# Patient Record
Sex: Male | Born: 1985
Health system: Southern US, Community
[De-identification: ages and names within clinical notes are randomized; demographics above are authoritative.]

## PROBLEM LIST (undated history)

## (undated) DIAGNOSIS — F419 Anxiety disorder, unspecified: Secondary | ICD-10-CM

## (undated) DIAGNOSIS — K219 Gastro-esophageal reflux disease without esophagitis: Secondary | ICD-10-CM

## (undated) DIAGNOSIS — E079 Disorder of thyroid, unspecified: Secondary | ICD-10-CM

## (undated) HISTORY — DX: Anxiety disorder, unspecified: F41.9

## (undated) HISTORY — DX: Gastro-esophageal reflux disease without esophagitis: K21.9

## (undated) HISTORY — DX: Disorder of thyroid, unspecified: E07.9

---

## 2015-05-28 ENCOUNTER — Ambulatory Visit (INDEPENDENT_AMBULATORY_CARE_PROVIDER_SITE_OTHER): Payer: BLUE CROSS/BLUE SHIELD | Admitting: *Deleted

## 2015-05-28 DIAGNOSIS — Z23 Encounter for immunization: Secondary | ICD-10-CM | POA: Diagnosis not present

## 2016-03-18 DIAGNOSIS — E0590 Thyrotoxicosis, unspecified without thyrotoxic crisis or storm: Secondary | ICD-10-CM | POA: Diagnosis not present

## 2016-03-18 DIAGNOSIS — F411 Generalized anxiety disorder: Secondary | ICD-10-CM | POA: Diagnosis not present

## 2016-03-18 DIAGNOSIS — E785 Hyperlipidemia, unspecified: Secondary | ICD-10-CM | POA: Diagnosis not present

## 2016-05-22 DIAGNOSIS — I10 Essential (primary) hypertension: Secondary | ICD-10-CM | POA: Diagnosis not present

## 2016-05-22 DIAGNOSIS — E0590 Thyrotoxicosis, unspecified without thyrotoxic crisis or storm: Secondary | ICD-10-CM | POA: Diagnosis not present

## 2016-05-22 DIAGNOSIS — E785 Hyperlipidemia, unspecified: Secondary | ICD-10-CM | POA: Diagnosis not present

## 2016-11-20 DIAGNOSIS — E785 Hyperlipidemia, unspecified: Secondary | ICD-10-CM | POA: Diagnosis not present

## 2016-11-20 DIAGNOSIS — E0590 Thyrotoxicosis, unspecified without thyrotoxic crisis or storm: Secondary | ICD-10-CM | POA: Diagnosis not present

## 2016-11-20 DIAGNOSIS — Z0000 Encounter for general adult medical examination without abnormal findings: Secondary | ICD-10-CM | POA: Diagnosis not present

## 2016-11-20 DIAGNOSIS — I10 Essential (primary) hypertension: Secondary | ICD-10-CM | POA: Diagnosis not present

## 2017-06-16 DIAGNOSIS — E785 Hyperlipidemia, unspecified: Secondary | ICD-10-CM | POA: Diagnosis not present

## 2017-06-16 DIAGNOSIS — E0590 Thyrotoxicosis, unspecified without thyrotoxic crisis or storm: Secondary | ICD-10-CM | POA: Diagnosis not present

## 2017-06-16 DIAGNOSIS — I10 Essential (primary) hypertension: Secondary | ICD-10-CM | POA: Diagnosis not present

## 2017-09-25 ENCOUNTER — Ambulatory Visit: Payer: BLUE CROSS/BLUE SHIELD | Admitting: Physician Assistant

## 2017-09-25 ENCOUNTER — Encounter: Payer: Self-pay | Admitting: Physician Assistant

## 2017-09-25 VITALS — BP 139/82 | HR 109 | Temp 97.0°F | Wt 226.4 lb

## 2017-09-25 DIAGNOSIS — F419 Anxiety disorder, unspecified: Secondary | ICD-10-CM | POA: Diagnosis not present

## 2017-09-25 MED ORDER — ALPRAZOLAM 0.5 MG PO TABS
0.5000 mg | ORAL_TABLET | Freq: Two times a day (BID) | ORAL | 1 refills | Status: DC | PRN
Start: 2017-09-25 — End: 2017-10-28

## 2017-09-25 MED ORDER — CITALOPRAM HYDROBROMIDE 20 MG PO TABS: 20.0000 mg | ORAL_TABLET | Freq: Every day | ORAL | 3 refills | Status: DC

## 2017-09-25 NOTE — Patient Instructions (Signed)
Panic Attacks Panic attacks are sudden, short feelings of great fear or discomfort. You may have them for no reason when you are relaxed, when you are uneasy (anxious), or when you are sleeping. Follow these instructions at home:  Take all your medicines as told.  Check with your doctor before starting new medicines.  Keep all doctor visits. Contact a doctor if:  You are not able to take your medicines as told.  Your symptoms do not get better.  Your symptoms get worse. Get help right away if:  Your attacks seem different than your normal attacks.  You have thoughts about hurting yourself or others.  You take panic attack medicine and you have a side effect. This information is not intended to replace advice given to you by your health care provider. Make sure you discuss any questions you have with your health care provider. Document Released: 09/06/2010 Document Revised: 01/10/2016 Document Reviewed: 03/18/2013 Elsevier Interactive Patient Education  2017 Elsevier Inc.  

## 2017-09-25 NOTE — Progress Notes (Signed)
BP 139/82   Pulse (!) 109   Temp (!) 97 F (36.1 C) (Oral)   Wt 226 lb 6.4 oz (102.7 kg)    Subjective:    Patient ID: Christian Fowler, male    DOB: 08/21/1985, 32 y.o.   MRN: [id number]  Christian Fowler is a 32 y.o. male presenting on 09/25/2017 for New Patient (Initial Visit) and Establish Care  Patient with primary complaint of depression and anxiety.  Has had panic attacks over the past few months. Quit drinking alcohol Aug 18, 2017.  Strong family history of anxiety and depression.  Over does well and no significant illnesses.  He is due for labs in the near future.  Depression screen PHQ 2/9 09/25/2017  Decreased Interest 0  Down, Depressed, Hopeless 0  PHQ - 2 Score 0    Past Medical History:  Diagnosis Date  . Anxiety   . GERD (gastroesophageal reflux disease)   . Thyroid disease    Relevant past medical, surgical, family and social history reviewed and updated as indicated. Interim medical history since our last visit reviewed. Allergies and medications reviewed and updated.   Data reviewed from any sources in EPIC.  Review of Systems  Constitutional: Negative.  Negative for appetite change and fatigue.  HENT: Negative.   Eyes: Negative.  Negative for pain and visual disturbance.  Respiratory: Negative.  Negative for cough, chest tightness, shortness of breath and wheezing.   Cardiovascular: Negative.  Negative for chest pain, palpitations and leg swelling.  Gastrointestinal: Negative.  Negative for abdominal pain, diarrhea, nausea and vomiting.  Endocrine: Negative.   Genitourinary: Negative.   Musculoskeletal: Negative.   Skin: Negative.  Negative for color change and rash.  Neurological: Negative.  Negative for weakness, numbness and headaches.  Psychiatric/Behavioral: Positive for decreased concentration and sleep disturbance. The patient is nervous/anxious.      Social History   Socioeconomic History  . Marital status: Single    Spouse name: Not on file  .  Number of children: Not on file  . Years of education: Not on file  . Highest education level: Not on file  Social Needs  . Financial resource strain: Not on file  . Food insecurity - worry: Not on file  . Food insecurity - inability: Not on file  . Transportation needs - medical: Not on file  . Transportation needs - non-medical: Not on file  Occupational History  . Not on file  Tobacco Use  . Smoking status: Never Smoker  . Smokeless tobacco: Never Used  Substance and Sexual Activity  . Alcohol use: Yes    Alcohol/week: 1.2 oz    Types: 2 Cans of beer per week  . Drug use: No  . Sexual activity: Not Currently  Other Topics Concern  . Not on file  Social History Narrative  . Not on file    History reviewed. No pertinent surgical history.  History reviewed. No pertinent family history.  Allergies as of 09/25/2017   No Known Allergies     Medication List        Accurate as of 09/25/17  9:56 AM. Always use your most recent med list.          ALPRAZolam 0.5 MG tablet Commonly known as:  XANAX Take 1 tablet (0.5 mg total) by mouth 2 (two) times daily as needed for anxiety.   citalopram 20 MG tablet Commonly known as:  CELEXA Take 1 tablet (20 mg total) by mouth daily.   KLOR-CON M20  20 MEQ tablet Generic drug:  potassium chloride SA   methimazole 5 MG tablet Commonly known as:  TAPAZOLE TAKE ONE TABLET (5 MG) BY MOUTH EVERY OTHER DAY ALTERNATING  WITH  2  TABLETS  EVERY  OTHER  DAY   multivitamin tablet Take 1 tablet by mouth daily.   simvastatin 20 MG tablet Commonly known as:  ZOCOR Take by mouth.          Objective:    BP 139/82   Pulse (!) 109   Temp (!) 97 F (36.1 C) (Oral)   Wt 226 lb 6.4 oz (102.7 kg)   No Known Allergies Wt Readings from Last 3 Encounters:  09/25/17 226 lb 6.4 oz (102.7 kg)    Physical Exam  Constitutional: He appears well-developed and well-nourished.  HENT:  Head: Normocephalic and atraumatic.  Eyes: Conjunctivae  and EOM are normal. Pupils are equal, round, and reactive to light.  Neck: Normal range of motion. Neck supple.  Cardiovascular: Normal rate, regular rhythm and normal heart sounds.  Pulmonary/Chest: Effort normal and breath sounds normal.  Abdominal: Soft. Bowel sounds are normal.  Musculoskeletal: Normal range of motion.  Skin: Skin is warm and dry.  Nursing note and vitals reviewed.   No results found for this or any previous visit.    Assessment & Plan:   1. Anxiety disorder, unspecified type - citalopram (CELEXA) 20 MG tablet; Take 1 tablet (20 mg total) by mouth daily.  Dispense: 30 tablet; Refill: 3 - ALPRAZolam (XANAX) 0.5 MG tablet; Take 1 tablet (0.5 mg total) by mouth 2 (two) times daily as needed for anxiety.  Dispense: 20 tablet; Refill: 1    Continue all other maintenance medications as listed above. Educational handout given for survey  Follow up plan: Return in about 4 weeks (around 10/23/2017) for recheck.  Remus Loffler PA-C [facility] [address]  [place], [place] [address] [phone]   09/25/2017, 9:56 AM

## 2017-10-08 ENCOUNTER — Ambulatory Visit (INDEPENDENT_AMBULATORY_CARE_PROVIDER_SITE_OTHER): Payer: BLUE CROSS/BLUE SHIELD

## 2017-10-08 ENCOUNTER — Ambulatory Visit: Payer: BLUE CROSS/BLUE SHIELD | Admitting: Family

## 2017-10-08 ENCOUNTER — Encounter: Payer: Self-pay | Admitting: Family

## 2017-10-08 VITALS — BP 126/80 | HR 124 | Temp 97.0°F | Ht 70.0 in | Wt 226.0 lb

## 2017-10-08 DIAGNOSIS — R6889 Other general symptoms and signs: Secondary | ICD-10-CM

## 2017-10-08 DIAGNOSIS — R0602 Shortness of breath: Secondary | ICD-10-CM

## 2017-10-08 DIAGNOSIS — R05 Cough: Secondary | ICD-10-CM | POA: Diagnosis not present

## 2017-10-08 LAB — VERITOR FLU A/B WAIVED
Influenza A: NEGATIVE
Influenza B: NEGATIVE

## 2017-10-08 MED ORDER — OSELTAMIVIR PHOSPHATE 75 MG PO CAPS: 75.0000 mg | ORAL_CAPSULE | Freq: Two times a day (BID) | ORAL | 0 refills | Status: DC

## 2017-10-08 NOTE — Patient Instructions (Signed)

## 2017-10-08 NOTE — Progress Notes (Signed)
   Subjective:    Patient ID: Barron Schmidexter Quinonez, male    DOB: 01/02/1986, 32 y.o.   MRN: [id number]  Sinusitis  This is a new problem. The current episode started in the past 7 days. The problem has been waxing and waning since onset. There has been no fever. His pain is at a severity of 5/10. He is experiencing no pain. Associated symptoms include chills, congestion, coughing, headaches, a hoarse voice, shortness of breath, sinus pressure and sneezing. Pertinent negatives include no ear pain or sore throat. Past treatments include oral decongestants. The treatment provided mild relief.      Review of Systems  Constitutional: Positive for chills.  HENT: Positive for congestion, hoarse voice, sinus pressure and sneezing. Negative for ear pain and sore throat.   Respiratory: Positive for cough and shortness of breath.   Neurological: Positive for headaches.  All other systems reviewed and are negative.      Objective:   Physical Exam  Constitutional: He is oriented to person, place, and time. He appears well-developed and well-nourished. He appears ill. No distress.  HENT:  Head: Normocephalic.  Right Ear: External ear normal.  Left Ear: External ear normal.  Nose: Mucosal edema and rhinorrhea present.  Mouth/Throat: Posterior oropharyngeal erythema present.  Eyes: Pupils are equal, round, and reactive to light. Right eye exhibits no discharge. Left eye exhibits no discharge.  Neck: Normal range of motion. Neck supple. No thyromegaly present.  Cardiovascular: Regular rhythm, normal heart sounds and intact distal pulses. Tachycardia present.  No murmur heard. Pulmonary/Chest: Effort normal and breath sounds normal. No respiratory distress. He has no wheezes.  Abdominal: Soft. Bowel sounds are normal. He exhibits no distension. There is no tenderness.  Musculoskeletal: Normal range of motion. He exhibits no edema or tenderness.  Neurological: He is alert and oriented to person, place, and  time.  Skin: Skin is warm and dry. No rash noted. No erythema.  Psychiatric: He has a normal mood and affect. His behavior is normal. Judgment and thought content normal.  Vitals reviewed.     BP 126/80   Pulse (!) 124 Comment: 114  Temp (!) 97 F (36.1 C) (Oral)   Ht 5\' 10"  (1.778 m)   Wt 226 lb (102.5 kg)   SpO2 96%   BMI 32.43 kg/m      Assessment & Plan:  1. Flu-like symptoms Rest Force fluids Tylenol or motrin Good hand hygiene discussed, cover mouth when coughing or sneezing RTO prn or if symptoms worsen or do not improve - Veritor Flu A/B Waived - DG Chest 2 View; Future - oseltamivir (TAMIFLU) 75 MG capsule; Take 1 capsule (75 mg total) by mouth 2 (two) times daily.  Dispense: 10 capsule; Refill: 0  2. SOB (shortness of breath) - DG Chest 2 View; Future    Jannifer Rodneyhristy Aldon Hengst, FNP

## 2017-10-28 ENCOUNTER — Encounter: Payer: Self-pay | Admitting: Physician Assistant

## 2017-10-28 ENCOUNTER — Ambulatory Visit: Payer: BLUE CROSS/BLUE SHIELD | Admitting: Physician Assistant

## 2017-10-28 ENCOUNTER — Ambulatory Visit (INDEPENDENT_AMBULATORY_CARE_PROVIDER_SITE_OTHER): Payer: BLUE CROSS/BLUE SHIELD | Admitting: Physician Assistant

## 2017-10-28 VITALS — BP 124/70 | HR 72 | Temp 97.0°F | Ht 70.0 in | Wt 229.2 lb

## 2017-10-28 DIAGNOSIS — E0590 Thyrotoxicosis, unspecified without thyrotoxic crisis or storm: Secondary | ICD-10-CM

## 2017-10-28 DIAGNOSIS — Z0000 Encounter for general adult medical examination without abnormal findings: Secondary | ICD-10-CM | POA: Diagnosis not present

## 2017-10-28 DIAGNOSIS — L309 Dermatitis, unspecified: Secondary | ICD-10-CM

## 2017-10-28 DIAGNOSIS — E876 Hypokalemia: Secondary | ICD-10-CM

## 2017-10-28 DIAGNOSIS — F419 Anxiety disorder, unspecified: Secondary | ICD-10-CM | POA: Insufficient documentation

## 2017-10-28 MED ORDER — CITALOPRAM HYDROBROMIDE 20 MG PO TABS: 20.0000 mg | ORAL_TABLET | Freq: Every day | ORAL | 5 refills | Status: DC

## 2017-10-28 MED ORDER — METHIMAZOLE 5 MG PO TABS: ORAL_TABLET | ORAL | 5 refills | Status: DC

## 2017-10-28 MED ORDER — KLOR-CON M20 20 MEQ PO TBCR: 20.0000 meq | EXTENDED_RELEASE_TABLET | Freq: Once | ORAL | 5 refills | Status: DC

## 2017-10-28 MED ORDER — ALPRAZOLAM 0.5 MG PO TABS: 0.5000 mg | ORAL_TABLET | Freq: Two times a day (BID) | ORAL | 2 refills | Status: DC | PRN

## 2017-10-28 MED ORDER — CLOBETASOL PROPIONATE 0.05 % EX CREA: 1.0000 "application " | TOPICAL_CREAM | Freq: Two times a day (BID) | CUTANEOUS | 2 refills | Status: DC

## 2017-10-28 NOTE — Progress Notes (Signed)
BP 124/70   Pulse 72   Temp (!) 97 F (36.1 C) (Oral)   Ht 5\' 10"  (1.778 m)   Wt 229 lb 3.2 oz (104 kg)   BMI 32.89 kg/m    Subjective:    Patient ID: Christian Fowler, male    DOB: 04-14-1986, 32 y.o.   MRN: [id number]  HPI: Christian Fowler is a 32 y.o. male presenting on 10/28/2017 for Annual Exam (and rck on medications )  This patient comes in for annual well physical examination. All medications are reviewed today. There are no reports of any problems with the medications. All of the medical conditions are reviewed and updated.  Lab work is reviewed and will be ordered as medically necessary. There are no new problems reported with today's visit.  Patient reports doing well overall.  Sending medications to pharmacy.  Past Medical History:  Diagnosis Date  . Anxiety   . GERD (gastroesophageal reflux disease)   . Thyroid disease    Relevant past medical, surgical, family and social history reviewed and updated as indicated. Interim medical history since our last visit reviewed. Allergies and medications reviewed and updated. DATA REVIEWED: CHART IN EPIC  Family History reviewed for pertinent findings.  Review of Systems  Constitutional: Negative.  Negative for appetite change and fatigue.  HENT: Negative.   Eyes: Negative.  Negative for pain and visual disturbance.  Respiratory: Negative.  Negative for cough, chest tightness, shortness of breath and wheezing.   Cardiovascular: Negative.  Negative for chest pain, palpitations and leg swelling.  Gastrointestinal: Negative.  Negative for abdominal pain, diarrhea, nausea and vomiting.  Endocrine: Negative.   Genitourinary: Negative.   Musculoskeletal: Negative.   Skin: Negative.  Negative for color change and rash.  Neurological: Negative.  Negative for weakness, numbness and headaches.  Psychiatric/Behavioral: Positive for decreased concentration. Negative for sleep disturbance and suicidal ideas. The patient is  nervous/anxious.     Allergies as of 10/28/2017   No Known Allergies     Medication List        Accurate as of 10/28/17 10:58 AM. Always use your most recent med list.          ALPRAZolam 0.5 MG tablet Commonly known as:  XANAX Take 1 tablet (0.5 mg total) by mouth 2 (two) times daily as needed for anxiety.   citalopram 20 MG tablet Commonly known as:  CELEXA Take 1 tablet (20 mg total) by mouth daily.   clobetasol cream 0.05 % Commonly known as:  TEMOVATE Apply 1 application topically 2 (two) times daily.   KLOR-CON M20 20 MEQ tablet Generic drug:  potassium chloride SA Take 1 tablet (20 mEq total) by mouth once for 1 dose.   methimazole 5 MG tablet Commonly known as:  TAPAZOLE TAKE ONE TABLET (5 MG) BY MOUTH EVERY OTHER DAY ALTERNATING  WITH  2  TABLETS  EVERY  OTHER  DAY   multivitamin tablet Take 1 tablet by mouth daily.          Objective:    BP 124/70   Pulse 72   Temp (!) 97 F (36.1 C) (Oral)   Ht 5\' 10"  (1.778 m)   Wt 229 lb 3.2 oz (104 kg)   BMI 32.89 kg/m   No Known Allergies  Wt Readings from Last 3 Encounters:  10/28/17 229 lb 3.2 oz (104 kg)  10/08/17 226 lb (102.5 kg)  09/25/17 226 lb 6.4 oz (102.7 kg)    Physical Exam  Constitutional: He appears  well-developed and well-nourished.  HENT:  Head: Normocephalic and atraumatic.  Eyes: Conjunctivae and EOM are normal. Pupils are equal, round, and reactive to light.  Neck: Normal range of motion. Neck supple.  Cardiovascular: Normal rate, regular rhythm and normal heart sounds.  Pulmonary/Chest: Effort normal and breath sounds normal.  Abdominal: Soft. Bowel sounds are normal.  Musculoskeletal: Normal range of motion.  Skin: Skin is warm and dry.    Results for orders placed or performed in visit on 10/08/17  Veritor Flu A/B Waived  Result Value Ref Range   Influenza A Negative Negative   Influenza B Negative Negative      Assessment & Plan:   1. Well adult exam  2.  Anxiety  3. Anxiety disorder, unspecified type - ALPRAZolam (XANAX) 0.5 MG tablet; Take 1 tablet (0.5 mg total) by mouth 2 (two) times daily as needed for anxiety.  Dispense: 30 tablet; Refill: 2 - citalopram (CELEXA) 20 MG tablet; Take 1 tablet (20 mg total) by mouth daily.  Dispense: 30 tablet; Refill: 5   Continue all other maintenance medications as listed above.  Follow up plan: Return in about 4 weeks (around 11/25/2017) for recheck and labs, RECORD RELEASE.  Educational handout given for survey  Remus LofflerAngel S. Braian Tijerina PA-C [facility] [address]  [place], [place] [id number] [phone]   10/28/2017, 10:58 AM

## 2017-10-28 NOTE — Patient Instructions (Signed)
In a few days you may receive a survey in the mail or online from [employer] regarding your visit with us today. Please take a moment to fill this out. Your feedback is very important to our whole office. It can help us better understand your needs as well as improve your experience and satisfaction. Thank you for taking your time to complete it. We care about you.  Eljay Lave, PA-C  

## 2017-10-29 ENCOUNTER — Telehealth: Payer: Self-pay

## 2017-10-29 NOTE — Telephone Encounter (Signed)
Eczema

## 2017-10-29 NOTE — Telephone Encounter (Signed)
I am getting a prior auth for Clobetasol cream   I need a DX?

## 2017-10-30 ENCOUNTER — Other Ambulatory Visit: Payer: Self-pay | Admitting: Physician Assistant

## 2017-10-30 DIAGNOSIS — E876 Hypokalemia: Secondary | ICD-10-CM

## 2017-10-30 MED ORDER — KLOR-CON M20 20 MEQ PO TBCR: 20.0000 meq | EXTENDED_RELEASE_TABLET | Freq: Every day | ORAL | 5 refills | Status: DC

## 2017-11-04 ENCOUNTER — Telehealth: Payer: Self-pay | Admitting: Physician Assistant

## 2017-11-05 ENCOUNTER — Telehealth: Payer: Self-pay

## 2017-11-05 NOTE — Telephone Encounter (Signed)
Insurance denied Clobetasol cream  Formulary are Augmented betamethasone gel/ointment  Clobetasol 0.05% ointment/solution  Halobetasol cream/ointment

## 2017-11-05 NOTE — Telephone Encounter (Signed)
In process 

## 2017-11-06 ENCOUNTER — Other Ambulatory Visit: Payer: Self-pay | Admitting: Physician Assistant

## 2017-11-06 MED ORDER — CLOBETASOL PROPIONATE 0.05 % EX OINT: 1.0000 "application " | TOPICAL_OINTMENT | Freq: Two times a day (BID) | CUTANEOUS | 3 refills | Status: DC

## 2017-12-01 ENCOUNTER — Ambulatory Visit: Payer: BLUE CROSS/BLUE SHIELD | Admitting: Physician Assistant

## 2017-12-01 ENCOUNTER — Encounter: Payer: Self-pay | Admitting: Physician Assistant

## 2017-12-01 VITALS — BP 108/67 | HR 73 | Temp 97.4°F | Ht 70.0 in | Wt 225.2 lb

## 2017-12-01 DIAGNOSIS — Z0000 Encounter for general adult medical examination without abnormal findings: Secondary | ICD-10-CM | POA: Diagnosis not present

## 2017-12-01 DIAGNOSIS — F419 Anxiety disorder, unspecified: Secondary | ICD-10-CM | POA: Diagnosis not present

## 2017-12-01 DIAGNOSIS — B356 Tinea cruris: Secondary | ICD-10-CM

## 2017-12-01 DIAGNOSIS — E876 Hypokalemia: Secondary | ICD-10-CM | POA: Diagnosis not present

## 2017-12-01 DIAGNOSIS — L309 Dermatitis, unspecified: Secondary | ICD-10-CM | POA: Insufficient documentation

## 2017-12-01 DIAGNOSIS — E0590 Thyrotoxicosis, unspecified without thyrotoxic crisis or storm: Secondary | ICD-10-CM | POA: Diagnosis not present

## 2017-12-01 MED ORDER — ALPRAZOLAM 0.5 MG PO TABS: 0.5000 mg | ORAL_TABLET | Freq: Two times a day (BID) | ORAL | 5 refills | Status: DC | PRN

## 2017-12-01 MED ORDER — CICLOPIROX OLAMINE 0.77 % EX CREA: TOPICAL_CREAM | Freq: Two times a day (BID) | CUTANEOUS | 5 refills | Status: DC

## 2017-12-02 ENCOUNTER — Ambulatory Visit: Payer: BLUE CROSS/BLUE SHIELD | Admitting: Physician Assistant

## 2017-12-02 LAB — CBC WITH DIFFERENTIAL/PLATELET
BASOS ABS: 0 10*3/uL (ref 0.0–0.2)
Basos: 1 %
EOS (ABSOLUTE): 0.2 10*3/uL (ref 0.0–0.4)
Eos: 3 %
HEMATOCRIT: 47 % (ref 37.5–51.0)
Hemoglobin: 15.4 g/dL (ref 13.0–17.7)
Immature Grans (Abs): 0 10*3/uL (ref 0.0–0.1)
Immature Granulocytes: 0 %
LYMPHS ABS: 2.1 10*3/uL (ref 0.7–3.1)
Lymphs: 35 %
MCH: 27.9 pg (ref 26.6–33.0)
MCHC: 32.8 g/dL (ref 31.5–35.7)
MCV: 85 fL (ref 79–97)
MONOS ABS: 0.6 10*3/uL (ref 0.1–0.9)
Monocytes: 10 %
NEUTROS PCT: 51 %
Neutrophils Absolute: 3.1 10*3/uL (ref 1.4–7.0)
PLATELETS: 280 10*3/uL (ref 150–379)
RBC: 5.51 x10E6/uL (ref 4.14–5.80)
RDW: 13.8 % (ref 12.3–15.4)
WBC: 6 10*3/uL (ref 3.4–10.8)

## 2017-12-02 LAB — THYROID PANEL WITH TSH
Free Thyroxine Index: 1.5 (ref 1.2–4.9)
T3 UPTAKE RATIO: 23 % — AB (ref 24–39)
T4, Total: 6.6 ug/dL (ref 4.5–12.0)
TSH: 2.16 u[IU]/mL (ref 0.450–4.500)

## 2017-12-02 LAB — LIPID PANEL
CHOLESTEROL TOTAL: 144 mg/dL (ref 100–199)
Chol/HDL Ratio: 4.2 ratio (ref 0.0–5.0)
HDL: 34 mg/dL — AB (ref >39)
LDL CALC: 98 mg/dL (ref 0–99)
TRIGLYCERIDES: 61 mg/dL (ref 0–149)
VLDL Cholesterol Cal: 12 mg/dL (ref 5–40)

## 2017-12-02 LAB — CMP14+EGFR
ALK PHOS: 54 IU/L (ref 39–117)
ALT: 25 IU/L (ref 0–44)
AST: 18 IU/L (ref 0–40)
Albumin/Globulin Ratio: 2 (ref 1.2–2.2)
Albumin: 4.3 g/dL (ref 3.5–5.5)
BUN/Creatinine Ratio: 17 (ref 9–20)
BUN: 13 mg/dL (ref 6–20)
Bilirubin Total: 0.2 mg/dL (ref 0.0–1.2)
CO2: 25 mmol/L (ref 20–29)
Calcium: 8.9 mg/dL (ref 8.7–10.2)
Chloride: 105 mmol/L (ref 96–106)
Creatinine, Ser: 0.75 mg/dL — ABNORMAL LOW (ref 0.76–1.27)
GFR calc Af Amer: 143 mL/min/{1.73_m2} (ref >59)
GFR calc non Af Amer: 124 mL/min/{1.73_m2} (ref >59)
GLOBULIN, TOTAL: 2.1 g/dL (ref 1.5–4.5)
GLUCOSE: 77 mg/dL (ref 65–99)
Potassium: 4.2 mmol/L (ref 3.5–5.2)
SODIUM: 142 mmol/L (ref 134–144)
Total Protein: 6.4 g/dL (ref 6.0–8.5)

## 2017-12-02 NOTE — Progress Notes (Signed)
BP 108/67   Pulse 73   Temp (!) 97.4 F (36.3 C) (Oral)   Ht _0  (1.778 m)   Wt 225 lb 3.2 oz (102.2 kg)   BMI 32.31 kg/m    Subjective:    Patient ID: Christian Fowler, male    DOB: 1985/10/05, 32 y.o.   MRN: [id number]  HPI: Christian Fowler is a 32 y.o. male presenting on 12/01/2017 for Follow-up (1 month  and labs )  Patient comes in for recheck on his conditions.  He reports that the clobetasol has helped his hands tremendously.  However now he is having a red itchy rash in his groin.  Is been made worse when he is at work and very hot.  He is due lab work today.  Otherwise he is doing very well. Depression screen [facility] 2/9 12/01/2017 10/28/2017 10/08/2017 09/25/2017  Decreased Interest 0 0 0 0  Down, Depressed, Hopeless 0 0 0 0  PHQ - 2 Score 0 0 0 0     Past Medical History:  Diagnosis Date  . Anxiety   . GERD (gastroesophageal reflux disease)   . Thyroid disease    Relevant past medical, surgical, family and social history reviewed and updated as indicated. Interim medical history since our last visit reviewed. Allergies and medications reviewed and updated. DATA REVIEWED: CHART IN EPIC  Family History reviewed for pertinent findings.  Review of Systems  Constitutional: Negative.  Negative for appetite change and fatigue.  HENT: Negative.   Eyes: Negative.  Negative for pain and visual disturbance.  Respiratory: Negative.  Negative for cough, chest tightness, shortness of breath and wheezing.   Cardiovascular: Negative.  Negative for chest pain, palpitations and leg swelling.  Gastrointestinal: Negative.  Negative for abdominal pain, diarrhea, nausea and vomiting.  Endocrine: Negative.   Genitourinary: Negative.   Musculoskeletal: Negative.   Skin: Positive for color change and rash.  Neurological: Negative.  Negative for weakness, numbness and headaches.  Psychiatric/Behavioral: Negative.     Allergies as of 12/01/2017   No Known Allergies     Medication List        Accurate as of 12/01/17 11:59 PM. Always use your most recent med list.          ALPRAZolam 0.5 MG tablet Commonly known as:  XANAX Take 1 tablet (0.5 mg total) by mouth 2 (two) times daily as needed for anxiety.   ciclopirox 0.77 % cream Commonly known as:  LOPROX Apply topically 2 (two) times daily. FOR fungal infection   citalopram 20 MG tablet Commonly known as:  CELEXA Take 1 tablet (20 mg total) by mouth daily.   clobetasol ointment 0.05 % Commonly known as:  TEMOVATE Apply 1 application topically 2 (two) times daily.   KLOR-CON M20 20 MEQ tablet Generic drug:  potassium chloride SA Take 1 tablet (20 mEq total) by mouth daily.   methimazole 5 MG tablet Commonly known as:  TAPAZOLE TAKE ONE TABLET (5 MG) BY MOUTH EVERY OTHER DAY ALTERNATING  WITH  2  TABLETS  EVERY  OTHER  DAY   multivitamin tablet Take 1 tablet by mouth daily.          Objective:    BP 108/67   Pulse 73   Temp (!) 97.4 F (36.3 C) (Oral)   Ht _1  (1.778 m)   Wt 225 lb 3.2 oz (102.2 kg)   BMI 32.31 kg/m   No Known Allergies  Wt Readings from Last 3 Encounters:  12/01/17  225 lb 3.2 oz (102.2 kg)  10/28/17 229 lb 3.2 oz (104 kg)  10/08/17 226 lb (102.5 kg)    Physical Exam  Constitutional: He appears well-developed and well-nourished. No distress.  HENT:  Head: Normocephalic and atraumatic.  Eyes: Pupils are equal, round, and reactive to light. Conjunctivae and EOM are normal.  Cardiovascular: Normal rate, regular rhythm and normal heart sounds.  Pulmonary/Chest: Effort normal and breath sounds normal. No respiratory distress.  Skin: Skin is warm and dry. Rash noted. There is erythema.  Psychiatric: He has a normal mood and affect. His behavior is normal.  Nursing note and vitals reviewed.   Results for orders placed or performed in visit on 12/01/17  CBC with Differential/Platelet  Result Value Ref Range   WBC 6.0 3.4 - 10.8 x10E3/uL   RBC 5.51 4.14 - 5.80 x10E6/uL    Hemoglobin 15.4 13.0 - 17.7 g/dL   Hematocrit 47.0 37.5 - 51.0 %   MCV 85 79 - 97 fL   MCH 27.9 26.6 - 33.0 pg   MCHC 32.8 31.5 - 35.7 g/dL   RDW 13.8 12.3 - 15.4 %   Platelets 280 150 - 379 x10E3/uL   Neutrophils 51 Not Estab. %   Lymphs 35 Not Estab. %   Monocytes 10 Not Estab. %   Eos 3 Not Estab. %   Basos 1 Not Estab. %   Neutrophils Absolute 3.1 1.4 - 7.0 x10E3/uL   Lymphocytes Absolute 2.1 0.7 - 3.1 x10E3/uL   Monocytes Absolute 0.6 0.1 - 0.9 x10E3/uL   EOS (ABSOLUTE) 0.2 0.0 - 0.4 x10E3/uL   Basophils Absolute 0.0 0.0 - 0.2 x10E3/uL   Immature Granulocytes 0 Not Estab. %   Immature Grans (Abs) 0.0 0.0 - 0.1 x10E3/uL  CMP14+EGFR  Result Value Ref Range   Glucose 77 65 - 99 mg/dL   BUN 13 6 - 20 mg/dL   Creatinine, Ser 0.75 (L) 0.76 - 1.27 mg/dL   GFR calc non Af Amer 124 >59 mL/min/1.73   GFR calc Af Amer 143 >59 mL/min/1.73   BUN/Creatinine Ratio 17 9 - 20   Sodium 142 134 - 144 mmol/L   Potassium 4.2 3.5 - 5.2 mmol/L   Chloride 105 96 - 106 mmol/L   CO2 25 20 - 29 mmol/L   Calcium 8.9 8.7 - 10.2 mg/dL   Total Protein 6.4 6.0 - 8.5 g/dL   Albumin 4.3 3.5 - 5.5 g/dL   Globulin, Total 2.1 1.5 - 4.5 g/dL   Albumin/Globulin Ratio 2.0 1.2 - 2.2   Bilirubin Total 0.2 0.0 - 1.2 mg/dL   Alkaline Phosphatase 54 39 - 117 IU/L   AST 18 0 - 40 IU/L   ALT 25 0 - 44 IU/L  Lipid panel  Result Value Ref Range   Cholesterol, Total 144 100 - 199 mg/dL   Triglycerides 61 0 - 149 mg/dL   HDL 34 (L) >39 mg/dL   VLDL Cholesterol Cal 12 5 - 40 mg/dL   LDL Calculated 98 0 - 99 mg/dL   Chol/HDL Ratio 4.2 0.0 - 5.0 ratio  Thyroid Panel With TSH  Result Value Ref Range   TSH 2.160 0.450 - 4.500 uIU/mL   T4, Total 6.6 4.5 - 12.0 ug/dL   T3 Uptake Ratio 23 (L) 24 - 39 %   Free Thyroxine Index 1.5 1.2 - 4.9      Assessment & Plan:   1. Anxiety disorder, unspecified type - ALPRAZolam (XANAX) 0.5 MG tablet; Take 1 tablet (0.5  mg total) by mouth 2 (two) times daily as needed for  anxiety.  Dispense: 30 tablet; Refill: 5  2. Hyperthyroidism - Thyroid Panel With TSH  3. Eczema, unspecified type  4. Tinea cruris - ciclopirox (LOPROX) 0.77 % cream; Apply topically 2 (two) times daily. FOR fungal infection  Dispense: 30 g; Refill: 5  5. Hypokalemia - CMP14+EGFR  6. Well adult exam - CBC with Differential/Platelet - CMP14+EGFR - Lipid panel - Thyroid Panel With TSH   Continue all other maintenance medications as listed above.  Follow up plan: Recheck 6 months  Educational handout given for [place] PA-C [place] [address]  [place], [place] [address] [phone]   12/02/2017, 10:17 AM

## 2018-06-01 ENCOUNTER — Encounter: Payer: Self-pay | Admitting: Physician Assistant

## 2018-06-01 ENCOUNTER — Ambulatory Visit: Payer: Managed Care, Other (non HMO) | Admitting: Physician Assistant

## 2018-06-01 VITALS — BP 130/76 | HR 73 | Ht 70.0 in | Wt 238.0 lb

## 2018-06-01 DIAGNOSIS — E876 Hypokalemia: Secondary | ICD-10-CM | POA: Diagnosis not present

## 2018-06-01 DIAGNOSIS — I8393 Asymptomatic varicose veins of bilateral lower extremities: Secondary | ICD-10-CM | POA: Diagnosis not present

## 2018-06-01 DIAGNOSIS — F419 Anxiety disorder, unspecified: Secondary | ICD-10-CM | POA: Diagnosis not present

## 2018-06-01 DIAGNOSIS — E0590 Thyrotoxicosis, unspecified without thyrotoxic crisis or storm: Secondary | ICD-10-CM | POA: Diagnosis not present

## 2018-06-01 DIAGNOSIS — Z0000 Encounter for general adult medical examination without abnormal findings: Secondary | ICD-10-CM

## 2018-06-01 MED ORDER — CITALOPRAM HYDROBROMIDE 20 MG PO TABS: 20.0000 mg | ORAL_TABLET | Freq: Every day | ORAL | 5 refills | Status: DC

## 2018-06-01 MED ORDER — METHIMAZOLE 5 MG PO TABS: ORAL_TABLET | ORAL | 5 refills | Status: DC

## 2018-06-01 MED ORDER — KLOR-CON M20 20 MEQ PO TBCR: 20.0000 meq | EXTENDED_RELEASE_TABLET | Freq: Every day | ORAL | 5 refills | Status: DC

## 2018-06-01 MED ORDER — ALPRAZOLAM 0.5 MG PO TABS: 0.5000 mg | ORAL_TABLET | Freq: Two times a day (BID) | ORAL | 5 refills | Status: DC | PRN

## 2018-06-01 NOTE — Progress Notes (Signed)
BP 130/76   Pulse 73   Ht _0  (1.778 m)   Wt 238 lb (108 kg)   BMI 34.15 kg/m    Subjective:    Patient ID: Christian Fowler, male    DOB: 11-16-1985, 32 y.o.   MRN: [id number]  HPI: Christian Fowler is a 32 y.o. male presenting on 06/01/2018 for Hyperthyroidism and Anxiety  This patient comes in for periodic recheck on medications and conditions including anxiety disorder, hypothyroidism controlled pantoprazole, hypokalemia.  At last labs everything was very stable.  We will recheck labs today.  He does take one Xanax a day before he goes into work.  There is a significant amount of stress going on there.  He reports that he is doing very well overall..   All medications are reviewed today. There are no reports of any problems with the medications. All of the medical conditions are reviewed and updated.  Lab work is reviewed and will be ordered as medically necessary. There are no new problems reported with today's visit. Depression screen [facility] 2/9 06/01/2018 12/01/2017 10/28/2017 10/08/2017 09/25/2017  Decreased Interest 0 0 0 0 0  Down, Depressed, Hopeless 0 0 0 0 0  PHQ - 2 Score 0 0 0 0 0     Past Medical History:  Diagnosis Date  . Anxiety   . GERD (gastroesophageal reflux disease)   . Thyroid disease    Relevant past medical, surgical, family and social history reviewed and updated as indicated. Interim medical history since our last visit reviewed. Allergies and medications reviewed and updated. DATA REVIEWED: CHART IN EPIC  Family History reviewed for pertinent findings.  Review of Systems  Constitutional: Negative.  Negative for appetite change and fatigue.  HENT: Negative.   Eyes: Negative.  Negative for pain and visual disturbance.  Respiratory: Negative.  Negative for cough, chest tightness, shortness of breath and wheezing.   Cardiovascular: Negative.  Negative for chest pain, palpitations and leg swelling.  Gastrointestinal: Negative.  Negative for abdominal pain,  diarrhea, nausea and vomiting.  Endocrine: Negative.   Genitourinary: Negative.   Musculoskeletal: Negative.   Skin: Negative.  Negative for color change and rash.  Neurological: Negative.  Negative for weakness, numbness and headaches.  Psychiatric/Behavioral: Negative.     Allergies as of 06/01/2018   No Known Allergies     Medication List        Accurate as of 06/01/18 10:46 AM. Always use your most recent med list.          ALPRAZolam 0.5 MG tablet Commonly known as:  XANAX Take 1 tablet (0.5 mg total) by mouth 2 (two) times daily as needed for anxiety.   citalopram 20 MG tablet Commonly known as:  CELEXA Take 1 tablet (20 mg total) by mouth daily.   KLOR-CON M20 20 MEQ tablet Generic drug:  potassium chloride SA Take 1 tablet (20 mEq total) by mouth daily.   methimazole 5 MG tablet Commonly known as:  TAPAZOLE TAKE ONE TABLET (5 MG) BY MOUTH EVERY OTHER DAY ALTERNATING  WITH  2  TABLETS  EVERY  OTHER  DAY   multivitamin tablet Take 1 tablet by mouth daily.          Objective:    BP 130/76   Pulse 73   Ht _1  (1.778 m)   Wt 238 lb (108 kg)   BMI 34.15 kg/m   No Known Allergies  Wt Readings from Last 3 Encounters:  06/01/18 238 lb (108 kg)  12/01/17 225 lb 3.2 oz (102.2 kg)  10/28/17 229 lb 3.2 oz (104 kg)    Physical Exam  Constitutional: He appears well-developed and well-nourished. No distress.  HENT:  Head: Normocephalic and atraumatic.  Eyes: Pupils are equal, round, and reactive to light. Conjunctivae and EOM are normal.  Cardiovascular: Normal rate, regular rhythm and normal heart sounds.  Varicose veins present bilateral lower legs, greater on right than left  Pulmonary/Chest: Effort normal and breath sounds normal. No respiratory distress.  Skin: Skin is warm and dry.  Psychiatric: He has a normal mood and affect. His behavior is normal.  Nursing note and vitals reviewed.   Results for orders placed or performed in visit on  12/01/17  CBC with Differential/Platelet  Result Value Ref Range   WBC 6.0 3.4 - 10.8 x10E3/uL   RBC 5.51 4.14 - 5.80 x10E6/uL   Hemoglobin 15.4 13.0 - 17.7 g/dL   Hematocrit 47.0 37.5 - 51.0 %   MCV 85 79 - 97 fL   MCH 27.9 26.6 - 33.0 pg   MCHC 32.8 31.5 - 35.7 g/dL   RDW 13.8 12.3 - 15.4 %   Platelets 280 150 - 379 x10E3/uL   Neutrophils 51 Not Estab. %   Lymphs 35 Not Estab. %   Monocytes 10 Not Estab. %   Eos 3 Not Estab. %   Basos 1 Not Estab. %   Neutrophils Absolute 3.1 1.4 - 7.0 x10E3/uL   Lymphocytes Absolute 2.1 0.7 - 3.1 x10E3/uL   Monocytes Absolute 0.6 0.1 - 0.9 x10E3/uL   EOS (ABSOLUTE) 0.2 0.0 - 0.4 x10E3/uL   Basophils Absolute 0.0 0.0 - 0.2 x10E3/uL   Immature Granulocytes 0 Not Estab. %   Immature Grans (Abs) 0.0 0.0 - 0.1 x10E3/uL  CMP14+EGFR  Result Value Ref Range   Glucose 77 65 - 99 mg/dL   BUN 13 6 - 20 mg/dL   Creatinine, Ser 0.75 (L) 0.76 - 1.27 mg/dL   GFR calc non Af Amer 124 >59 mL/min/1.73   GFR calc Af Amer 143 >59 mL/min/1.73   BUN/Creatinine Ratio 17 9 - 20   Sodium 142 134 - 144 mmol/L   Potassium 4.2 3.5 - 5.2 mmol/L   Chloride 105 96 - 106 mmol/L   CO2 25 20 - 29 mmol/L   Calcium 8.9 8.7 - 10.2 mg/dL   Total Protein 6.4 6.0 - 8.5 g/dL   Albumin 4.3 3.5 - 5.5 g/dL   Globulin, Total 2.1 1.5 - 4.5 g/dL   Albumin/Globulin Ratio 2.0 1.2 - 2.2   Bilirubin Total 0.2 0.0 - 1.2 mg/dL   Alkaline Phosphatase 54 39 - 117 IU/L   AST 18 0 - 40 IU/L   ALT 25 0 - 44 IU/L  Lipid panel  Result Value Ref Range   Cholesterol, Total 144 100 - 199 mg/dL   Triglycerides 61 0 - 149 mg/dL   HDL 34 (L) >39 mg/dL   VLDL Cholesterol Cal 12 5 - 40 mg/dL   LDL Calculated 98 0 - 99 mg/dL   Chol/HDL Ratio 4.2 0.0 - 5.0 ratio  Thyroid Panel With TSH  Result Value Ref Range   TSH 2.160 0.450 - 4.500 uIU/mL   T4, Total 6.6 4.5 - 12.0 ug/dL   T3 Uptake Ratio 23 (L) 24 - 39 %   Free Thyroxine Index 1.5 1.2 - 4.9      Assessment & Plan:   1. Anxiety  disorder, unspecified type - ALPRAZolam (XANAX) 0.5 MG tablet; Take  1 tablet (0.5 mg total) by mouth 2 (two) times daily as needed for anxiety.  Dispense: 30 tablet; Refill: 5 - citalopram (CELEXA) 20 MG tablet; Take 1 tablet (20 mg total) by mouth daily.  Dispense: 30 tablet; Refill: 5  2. Hyperthyroidism - methimazole (TAPAZOLE) 5 MG tablet; TAKE ONE TABLET (5 MG) BY MOUTH EVERY OTHER DAY ALTERNATING  WITH  2  TABLETS  EVERY  OTHER  DAY  Dispense: 45 tablet; Refill: 5 - Thyroid Panel With TSH  3. Hypokalemia - KLOR-CON M20 20 MEQ tablet; Take 1 tablet (20 mEq total) by mouth daily.  Dispense: 30 tablet; Refill: 5 - CMP14+EGFR  4. Well adult exam - Lipid panel  5. Varicose veins  Continue all other maintenance medications as listed above.  Follow up plan: Return in about 6 months (around 12/01/2018) for recheck.  Educational handout given for [place] PA-C [place] [address]  [place], [place] [address] [phone]   06/01/2018, 10:46 AM

## 2018-06-02 ENCOUNTER — Ambulatory Visit: Payer: BLUE CROSS/BLUE SHIELD | Admitting: Physician Assistant

## 2018-06-02 LAB — CMP14+EGFR
ALT: 28 IU/L (ref 0–44)
AST: 19 IU/L (ref 0–40)
Albumin/Globulin Ratio: 1.6 (ref 1.2–2.2)
Albumin: 4.4 g/dL (ref 3.5–5.5)
Alkaline Phosphatase: 58 IU/L (ref 39–117)
BUN/Creatinine Ratio: 10 (ref 9–20)
BUN: 9 mg/dL (ref 6–20)
Bilirubin Total: 0.5 mg/dL (ref 0.0–1.2)
CALCIUM: 9.3 mg/dL (ref 8.7–10.2)
CO2: 25 mmol/L (ref 20–29)
CREATININE: 0.94 mg/dL (ref 0.76–1.27)
Chloride: 100 mmol/L (ref 96–106)
GFR, EST AFRICAN AMERICAN: 125 mL/min/{1.73_m2} (ref >59)
GFR, EST NON AFRICAN AMERICAN: 108 mL/min/{1.73_m2} (ref >59)
GLOBULIN, TOTAL: 2.7 g/dL (ref 1.5–4.5)
Glucose: 88 mg/dL (ref 65–99)
Potassium: 3.9 mmol/L (ref 3.5–5.2)
Sodium: 141 mmol/L (ref 134–144)
TOTAL PROTEIN: 7.1 g/dL (ref 6.0–8.5)

## 2018-06-02 LAB — THYROID PANEL WITH TSH
Free Thyroxine Index: 1.9 (ref 1.2–4.9)
T3 Uptake Ratio: 26 % (ref 24–39)
T4, Total: 7.3 ug/dL (ref 4.5–12.0)
TSH: 1.32 u[IU]/mL (ref 0.450–4.500)

## 2018-06-02 LAB — LIPID PANEL
CHOL/HDL RATIO: 4.8 ratio (ref 0.0–5.0)
Cholesterol, Total: 182 mg/dL (ref 100–199)
HDL: 38 mg/dL — AB (ref >39)
LDL CALC: 124 mg/dL — AB (ref 0–99)
TRIGLYCERIDES: 98 mg/dL (ref 0–149)
VLDL Cholesterol Cal: 20 mg/dL (ref 5–40)

## 2018-11-03 ENCOUNTER — Other Ambulatory Visit: Payer: Self-pay | Admitting: Physician Assistant

## 2018-11-03 DIAGNOSIS — F419 Anxiety disorder, unspecified: Secondary | ICD-10-CM

## 2018-11-03 NOTE — Telephone Encounter (Signed)
Please advise 

## 2018-11-29 ENCOUNTER — Telehealth: Payer: Self-pay | Admitting: Physician Assistant

## 2018-11-29 NOTE — Telephone Encounter (Signed)
Patient was given a 30 day supply of xanax last month.  Can we make this a phone visit?

## 2018-11-29 NOTE — Telephone Encounter (Signed)
Patient notified

## 2018-11-29 NOTE — Telephone Encounter (Signed)
yes

## 2018-12-01 ENCOUNTER — Ambulatory Visit (INDEPENDENT_AMBULATORY_CARE_PROVIDER_SITE_OTHER): Payer: Managed Care, Other (non HMO) | Admitting: Physician Assistant

## 2018-12-01 ENCOUNTER — Other Ambulatory Visit: Payer: Self-pay

## 2018-12-01 DIAGNOSIS — E0590 Thyrotoxicosis, unspecified without thyrotoxic crisis or storm: Secondary | ICD-10-CM

## 2018-12-01 DIAGNOSIS — F419 Anxiety disorder, unspecified: Secondary | ICD-10-CM

## 2018-12-01 DIAGNOSIS — E876 Hypokalemia: Secondary | ICD-10-CM

## 2018-12-01 DIAGNOSIS — E039 Hypothyroidism, unspecified: Secondary | ICD-10-CM

## 2018-12-01 MED ORDER — METHIMAZOLE 5 MG PO TABS: ORAL_TABLET | ORAL | 5 refills | Status: DC

## 2018-12-01 MED ORDER — KLOR-CON M20 20 MEQ PO TBCR: 20.0000 meq | EXTENDED_RELEASE_TABLET | Freq: Every day | ORAL | 5 refills | Status: DC

## 2018-12-01 MED ORDER — ALPRAZOLAM 0.5 MG PO TABS: 0.5000 mg | ORAL_TABLET | Freq: Two times a day (BID) | ORAL | 0 refills | Status: DC | PRN

## 2018-12-02 ENCOUNTER — Encounter: Payer: Self-pay | Admitting: Physician Assistant

## 2018-12-02 ENCOUNTER — Telehealth: Payer: Self-pay | Admitting: Physician Assistant

## 2018-12-02 NOTE — Progress Notes (Signed)
Telephone visit  Subjective: CC: Anxiety, hyperthyroidism, hypokalemia PCP: Remus LofflerJones, [place]ucl[place] Cassetta S, PA-C ZOX:WRUEAVHPI:Uziah Franco ColletBartley is a 33 y.o. male calls for telephone consult today. Patient provides verbal consent for consult held via phone.  Patient is identified with 2 separate identifiers.  At this time the entire area is on COVID-19 social distancing and stay home orders are in place.  Patient is of higher risk and therefore we are performing this by a virtual method.  Location of patient: Home Location of provider: [facility] Others present for call: No  This is a periodic recheck for the patient's chronic medical conditions.  He states he has been doing well with his potassium and thyroid.  He has not had any additional symptoms.  However the COVID-19 and restrictions have had him having much more anxiety than normal.  He does have a young child and he is concerned about him getting sick. GAD 7 : Generalized Anxiety Score 12/01/2018  Nervous, Anxious, on Edge 3  Control/stop worrying 2  Worry too much - different things 2  Trouble relaxing 1  Restless 2  Easily annoyed or irritable 1  Afraid - awful might happen 2  Total GAD 7 Score 13    ANXIETY ASSESSMENT Cause of anxiety: generalized anxiety disorder with panic attacks  Current medications-citalopram gave him suicidal thoughts so it was discontinued.  He is using the alprazolam as needed.  We had a long conversation about BuSpar being used for persistent anxiety and he is willing to try it. Medication side effects-none Any concerns-none Any change in general medical condition-no Effectiveness of current meds-slightly decreased PMP AWARE website reviewed: Yes Any suspicious activity on PMP Aware: No LME daily dose: 2, with intermittent fills  Contract on file  History of overdose or risk of abuse no    ROS: Per HPI  No Known Allergies Past Medical History:  Diagnosis Date  . Anxiety   . GERD (gastroesophageal reflux disease)    . Thyroid disease     Current Outpatient Medications:  .  ALPRAZolam (XANAX) 0.5 MG tablet, Take 1 tablet (0.5 mg total) by mouth 2 (two) times daily as needed. for anxiety, Disp: 30 tablet, Rfl: 0 .  citalopram (CELEXA) 20 MG tablet, Take 1 tablet (20 mg total) by mouth daily., Disp: 30 tablet, Rfl: 5 .  KLOR-CON M20 20 MEQ tablet, Take 1 tablet (20 mEq total) by mouth daily., Disp: 30 tablet, Rfl: 5 .  methimazole (TAPAZOLE) 5 MG tablet, TAKE ONE TABLET (5 MG) BY MOUTH EVERY OTHER DAY ALTERNATING  WITH  2  TABLETS  EVERY  OTHER  DAY, Disp: 45 tablet, Rfl: 5 .  Multiple Vitamin (MULTIVITAMIN) tablet, Take 1 tablet by mouth daily., Disp: , Rfl:   Assessment/ Plan: 33 y.o. male   1. Anxiety disorder, unspecified type - ALPRAZolam (XANAX) 0.5 MG tablet; Take 1 tablet (0.5 mg total) by mouth 2 (two) times daily as needed. for anxiety  Dispense: 30 tablet; Refill: 0  2. Hypokalemia - KLOR-CON M20 20 MEQ tablet; Take 1 tablet (20 mEq total) by mouth daily.  Dispense: 30 tablet; Refill: 5  3. Hyperthyroidism - methimazole (TAPAZOLE) 5 MG tablet; TAKE ONE TABLET (5 MG) BY MOUTH EVERY OTHER DAY ALTERNATING  WITH  2  TABLETS  EVERY  OTHER  DAY  Dispense: 45 tablet; Refill: 5   Start time: 12:25 PM End time: 12:38 PM  Meds ordered this encounter  Medications  . ALPRAZolam (XANAX) 0.5 MG tablet    Sig: Take  1 tablet (0.5 mg total) by mouth 2 (two) times daily as needed. for anxiety    Dispense:  30 tablet    Refill:  0    Order Specific Question:   Supervising Provider    Answer:   Raliegh Ip [[id number]]  . KLOR-CON M20 20 MEQ tablet    Sig: Take 1 tablet (20 mEq total) by mouth daily.    Dispense:  30 tablet    Refill:  5    Order Specific Question:   Supervising Provider    Answer:   Raliegh Ip [[id number]]  . methimazole (TAPAZOLE) 5 MG tablet    Sig: TAKE ONE TABLET (5 MG) BY MOUTH EVERY OTHER DAY ALTERNATING  WITH  2  TABLETS  EVERY  OTHER  DAY    Dispense:  45  tablet    Refill:  5    Order Specific Question:   Supervising Provider    Answer:   Raliegh Ip [[id number]]    Prudy Feeler PA-C [facility] [phone]

## 2018-12-03 ENCOUNTER — Other Ambulatory Visit: Payer: Self-pay | Admitting: Physician Assistant

## 2018-12-03 MED ORDER — BUSPIRONE HCL 10 MG PO TABS: 10.0000 mg | ORAL_TABLET | Freq: Three times a day (TID) | ORAL | 5 refills | Status: DC

## 2018-12-03 NOTE — Telephone Encounter (Signed)
Aware.  Medication was sent in.

## 2018-12-03 NOTE — Telephone Encounter (Signed)
sent 

## 2018-12-25 ENCOUNTER — Encounter: Payer: Self-pay | Admitting: Physician Assistant

## 2018-12-27 ENCOUNTER — Telehealth: Payer: Self-pay | Admitting: Physician Assistant

## 2018-12-27 ENCOUNTER — Other Ambulatory Visit: Payer: Self-pay | Admitting: Physician Assistant

## 2018-12-27 DIAGNOSIS — F419 Anxiety disorder, unspecified: Secondary | ICD-10-CM

## 2018-12-27 MED ORDER — ALPRAZOLAM 0.5 MG PO TABS: 0.5000 mg | ORAL_TABLET | Freq: Two times a day (BID) | ORAL | 5 refills | Status: DC | PRN

## 2018-12-27 NOTE — Telephone Encounter (Signed)
Patient aware refills on file.

## 2019-06-08 ENCOUNTER — Telehealth: Payer: Self-pay | Admitting: Physician Assistant

## 2019-06-08 NOTE — Telephone Encounter (Signed)
Appointment scheduled 06/13/19 at 2:10 pm with Dr. Livia Snellen.

## 2019-06-10 ENCOUNTER — Other Ambulatory Visit: Payer: Self-pay

## 2019-06-13 ENCOUNTER — Other Ambulatory Visit: Payer: Self-pay

## 2019-06-13 ENCOUNTER — Encounter: Payer: Self-pay | Admitting: Family Medicine

## 2019-06-13 ENCOUNTER — Ambulatory Visit (INDEPENDENT_AMBULATORY_CARE_PROVIDER_SITE_OTHER): Payer: Managed Care, Other (non HMO) | Admitting: Family Medicine

## 2019-06-13 VITALS — BP 120/75 | HR 90 | Temp 99.0°F | Resp 20 | Ht 70.0 in | Wt 236.0 lb

## 2019-06-13 DIAGNOSIS — F419 Anxiety disorder, unspecified: Secondary | ICD-10-CM

## 2019-06-13 DIAGNOSIS — E0590 Thyrotoxicosis, unspecified without thyrotoxic crisis or storm: Secondary | ICD-10-CM

## 2019-06-13 DIAGNOSIS — Z024 Encounter for examination for driving license: Secondary | ICD-10-CM

## 2019-06-13 DIAGNOSIS — E669 Obesity, unspecified: Secondary | ICD-10-CM

## 2019-06-13 LAB — URINALYSIS
Bilirubin, UA: NEGATIVE
Glucose, UA: NEGATIVE
Ketones, UA: NEGATIVE
Leukocytes,UA: NEGATIVE
Nitrite, UA: NEGATIVE
Protein,UA: NEGATIVE
RBC, UA: NEGATIVE
Specific Gravity, UA: 1.02 (ref 1.005–1.030)
Urobilinogen, Ur: 0.2 mg/dL (ref 0.2–1.0)
pH, UA: 7 (ref 5.0–7.5)

## 2019-06-13 NOTE — Progress Notes (Signed)
Subjective:  Patient ID: Christian Fowler, male    DOB: 1985-09-18  Age: 33 y.o. MRN: [id number]  CC: DOT PE   HPI Christian Fowler presents for New DOT cert. Exam Laid off recently from [place]. Now going to work  Maintaining street lamps. Patient has a history of hypothyroidism which is managed by Aniceto Boss of this office.  He also has history of anxiety for which she takes BuSpar as needed as well as Xanax as needed.  Usually once a day for the BuSpar and once every day to every other day for the Xanax.  Depression screen Christian Fowler 2/9 06/13/2019 06/01/2018 12/01/2017  Decreased Interest 0 0 0  Down, Depressed, Hopeless 0 0 0  PHQ - 2 Score 0 0 0    History Christian Fowler has a past medical history of Anxiety, GERD (gastroesophageal reflux disease), and Thyroid disease.   He has no past surgical history on file.   His family history is not on file.He reports that he has never smoked. He has never used smokeless tobacco. He reports current alcohol use of about 2.0 standard drinks of alcohol per week. He reports that he does not use drugs.    ROS Review of Systems  Constitutional: Negative.   HENT: Negative.   Eyes: Negative for visual disturbance.  Respiratory: Negative for cough and shortness of breath.   Cardiovascular: Negative for chest pain and leg swelling.  Gastrointestinal: Negative for abdominal pain, diarrhea, nausea and vomiting.  Genitourinary: Negative for difficulty urinating.  Musculoskeletal: Negative for arthralgias and myalgias.  Skin: Negative for rash.  Neurological: Negative for headaches.  Psychiatric/Behavioral: Negative for sleep disturbance.    Objective:  BP 120/75   Pulse 90   Temp 99 F (37.2 C)   Resp 20   Ht 5\' 10"  (1.778 m)   Wt 236 lb (107 kg)   SpO2 99%   BMI 33.86 kg/m   BP Readings from Last 3 Encounters:  06/13/19 120/75  06/01/18 130/76  12/01/17 108/67    Wt Readings from Last 3 Encounters:  06/13/19 236 lb (107 kg)  06/01/18 238 lb  (108 kg)  12/01/17 225 lb 3.2 oz (102.2 kg)     Physical Exam Constitutional:      General: He is not in acute distress.    Appearance: He is well-developed.  HENT:     Head: Normocephalic and atraumatic.     Right Ear: External ear normal.     Left Ear: External ear normal.     Nose: Nose normal.  Eyes:     Conjunctiva/sclera: Conjunctivae normal.     Pupils: Pupils are equal, round, and reactive to light.  Neck:     Musculoskeletal: Normal range of motion and neck supple.  Cardiovascular:     Rate and Rhythm: Normal rate and regular rhythm.     Heart sounds: Normal heart sounds. No murmur.  Pulmonary:     Effort: Pulmonary effort is normal. No respiratory distress.     Breath sounds: Normal breath sounds. No wheezing or rales.  Abdominal:     Palpations: Abdomen is soft.     Tenderness: There is no abdominal tenderness.  Musculoskeletal: Normal range of motion.  Skin:    General: Skin is warm and dry.  Neurological:     Mental Status: He is alert and oriented to person, place, and time.     Deep Tendon Reflexes: Reflexes are normal and symmetric.  Psychiatric:        Behavior: Behavior  normal.        Thought Content: Thought content normal.        Judgment: Judgment normal.       Assessment & Plan:   Christian Fowler was seen today for dot pe.  Diagnoses and all orders for this visit:  Encounter for Department of Transportation (DOT) examination for driving license renewal -     Urinalysis  Hyperthyroidism  Anxiety disorder, unspecified type  Obesity (BMI 30.0-34.9)       I have discontinued Christian Fowler's citalopram. I am also having him maintain his multivitamin, Klor-Con M20, methimazole, busPIRone, and ALPRAZolam.  Allergies as of 06/13/2019      Reactions   Celexa [citalopram]    Suicidal thoughts       Medication List       Accurate as of June 13, 2019  3:03 PM. If you have any questions, ask your nurse or doctor.        STOP taking  these medications   citalopram 20 MG tablet Commonly known as: CELEXA Stopped by: Claretta Fraise, MD     TAKE these medications   ALPRAZolam 0.5 MG tablet Commonly known as: XANAX Take 1 tablet (0.5 mg total) by mouth 2 (two) times daily as needed. for anxiety   busPIRone 10 MG tablet Commonly known as: BUSPAR Take 1 tablet (10 mg total) by mouth 3 (three) times daily.   Klor-Con M20 20 MEQ tablet Generic drug: potassium chloride SA Take 1 tablet (20 mEq total) by mouth daily.   methimazole 5 MG tablet Commonly known as: TAPAZOLE TAKE ONE TABLET (5 MG) BY MOUTH EVERY OTHER DAY ALTERNATING  WITH  2  TABLETS  EVERY  OTHER  DAY   multivitamin tablet Take 1 tablet by mouth daily.      Discussed thyroid compliance, management. Weight loss. He should take the buspar BID on schedule so that he can wean off of the xanax.  Follow-up: Return in about 2 years (around 06/12/2021).  Claretta Fraise, M.D.

## 2019-06-30 ENCOUNTER — Telehealth: Payer: Self-pay | Admitting: Physician Assistant

## 2019-06-30 DIAGNOSIS — E876 Hypokalemia: Secondary | ICD-10-CM

## 2019-06-30 DIAGNOSIS — E0590 Thyrotoxicosis, unspecified without thyrotoxic crisis or storm: Secondary | ICD-10-CM

## 2019-06-30 MED ORDER — METHIMAZOLE 5 MG PO TABS: ORAL_TABLET | ORAL | 0 refills | Status: DC

## 2019-06-30 MED ORDER — KLOR-CON M20 20 MEQ PO TBCR: 20.0000 meq | EXTENDED_RELEASE_TABLET | Freq: Every day | ORAL | 0 refills | Status: DC

## 2019-06-30 MED ORDER — BUSPIRONE HCL 10 MG PO TABS: 10.0000 mg | ORAL_TABLET | Freq: Three times a day (TID) | ORAL | 0 refills | Status: DC

## 2019-06-30 NOTE — Telephone Encounter (Signed)
Pt aware 1 mos refill sent to pharmacy except alprazolam until appt with Glenard Haring this month

## 2019-07-12 ENCOUNTER — Ambulatory Visit (INDEPENDENT_AMBULATORY_CARE_PROVIDER_SITE_OTHER): Payer: Self-pay | Admitting: Physician Assistant

## 2019-07-12 DIAGNOSIS — F419 Anxiety disorder, unspecified: Secondary | ICD-10-CM

## 2019-07-12 DIAGNOSIS — E876 Hypokalemia: Secondary | ICD-10-CM

## 2019-07-12 DIAGNOSIS — E0590 Thyrotoxicosis, unspecified without thyrotoxic crisis or storm: Secondary | ICD-10-CM

## 2019-07-12 DIAGNOSIS — Z0000 Encounter for general adult medical examination without abnormal findings: Secondary | ICD-10-CM

## 2019-07-12 MED ORDER — METHIMAZOLE 5 MG PO TABS: ORAL_TABLET | ORAL | 0 refills | Status: DC

## 2019-07-12 MED ORDER — KLOR-CON M20 20 MEQ PO TBCR: 20.0000 meq | EXTENDED_RELEASE_TABLET | Freq: Every day | ORAL | 0 refills | Status: DC

## 2019-07-12 MED ORDER — ALPRAZOLAM 0.5 MG PO TABS: 0.5000 mg | ORAL_TABLET | Freq: Two times a day (BID) | ORAL | 5 refills | Status: DC | PRN

## 2019-07-13 ENCOUNTER — Telehealth: Payer: Self-pay | Admitting: *Deleted

## 2019-07-13 DIAGNOSIS — E876 Hypokalemia: Secondary | ICD-10-CM

## 2019-07-13 MED ORDER — POTASSIUM CHLORIDE CRYS ER 20 MEQ PO TBCR: 20.0000 meq | EXTENDED_RELEASE_TABLET | Freq: Every day | ORAL | 0 refills | Status: DC

## 2019-07-13 NOTE — Telephone Encounter (Signed)
yes

## 2019-07-13 NOTE — Telephone Encounter (Signed)
Fax from [facility] M20 20 meq [employer] cover brand, generic okay

## 2019-07-13 NOTE — Addendum Note (Signed)
Addended by: Antonietta Barcelona D on: 07/13/2019 09:24 AM   Modules accepted: Orders

## 2019-07-17 ENCOUNTER — Encounter: Payer: Self-pay | Admitting: Physician Assistant

## 2019-07-17 NOTE — Progress Notes (Signed)
Telephone visit  Subjective: CC: Recheck on chronic medical conditions PCP: Terald Sleeper, PA-C QJF:[id number] Jeff is a 33 y.o. male calls for telephone consult today. Patient provides verbal consent for consult held via phone.  Patient is identified with 2 separate identifiers.  At this time the entire area is on COVID-19 social distancing and stay home orders are in place.  Patient is of higher risk and therefore we are performing this by a virtual method.  Location of patient: Home Location of provider: HOME Others present for call: None  The patient is having a lot more anxiety and difficulty due to being laid off from his company.  He had been there many years.  And has had a permanent layoff.  He states that he does vape and is been doing more so.  I encouraged him to quit that as soon as possible.  He will try to work on this.  He also does have hyperthyroidism, GERD, anxiety.  All of his medications are reviewed and will be refilled.  And he will come in for fasting labs in the near future.   ROS: Per HPI  Allergies  Allergen Reactions  . Celexa [Citalopram]     Suicidal thoughts    Past Medical History:  Diagnosis Date  . Anxiety   . GERD (gastroesophageal reflux disease)   . Thyroid disease     Current Outpatient Medications:  .  ALPRAZolam (XANAX) 0.5 MG tablet, Take 1 tablet (0.5 mg total) by mouth 2 (two) times daily as needed. for anxiety, Disp: 30 tablet, Rfl: 5 .  methimazole (TAPAZOLE) 5 MG tablet, TAKE ONE TABLET (5 MG) BY MOUTH EVERY OTHER DAY ALTERNATING  WITH  2  TABLETS  EVERY  OTHER  DAY, Disp: 45 tablet, Rfl: 0 .  Multiple Vitamin (MULTIVITAMIN) tablet, Take 1 tablet by mouth daily., Disp: , Rfl:  .  potassium chloride SA (KLOR-CON M20) 20 MEQ tablet, Take 1 tablet (20 mEq total) by mouth daily., Disp: 30 tablet, Rfl: 0  Assessment/ Plan: 33 y.o. male   1. Anxiety disorder, unspecified type - ALPRAZolam (XANAX) 0.5 MG tablet; Take 1 tablet (0.5  mg total) by mouth 2 (two) times daily as needed. for anxiety  Dispense: 30 tablet; Refill: 5  2. Hypokalemia - CMP14+EGFR; Future  3. Hyperthyroidism - methimazole (TAPAZOLE) 5 MG tablet; TAKE ONE TABLET (5 MG) BY MOUTH EVERY OTHER DAY ALTERNATING  WITH  2  TABLETS  EVERY  OTHER  DAY  Dispense: 45 tablet; Refill: 0 - Thyroid Panel With TSH; Future  4. Well adult exam - CBC with Differential/Platelet; Future - CMP14+EGFR; Future - Lipid Panel; Future - Thyroid Panel With TSH; Future   Return in about 6 months (around 01/09/2020).  Continue all other maintenance medications as listed above.  Start time: 3:56 PM End time: 4:05 PM  Meds ordered this encounter  Medications  . ALPRAZolam (XANAX) 0.5 MG tablet    Sig: Take 1 tablet (0.5 mg total) by mouth 2 (two) times daily as needed. for anxiety    Dispense:  30 tablet    Refill:  5    Order Specific Question:   Supervising Provider    Answer:   Janora Norlander [[id number]]  . DISCONTD: KLOR-CON M20 20 MEQ tablet    Sig: Take 1 tablet (20 mEq total) by mouth daily.    Dispense:  30 tablet    Refill:  0    Order Specific Question:   Supervising Provider  AnswerJanora Norlander [[id number]]  . methimazole (TAPAZOLE) 5 MG tablet    Sig: TAKE ONE TABLET (5 MG) BY MOUTH EVERY OTHER DAY ALTERNATING  WITH  2  TABLETS  EVERY  OTHER  DAY    Dispense:  45 tablet    Refill:  0    Order Specific Question:   Supervising Provider    Answer:   Janora Norlander [[id number]]    Particia Nearing PA-C [facility] [phone]

## 2019-07-22 IMAGING — DX DG CHEST 2V
2 series · 2 of 2 positions shown · non-contrast
Comparison: None.

CLINICAL DATA: Cough.  Shortness of breath.  Flu-like symptoms.

EXAM:
CHEST  2 VIEW

[chest pa]
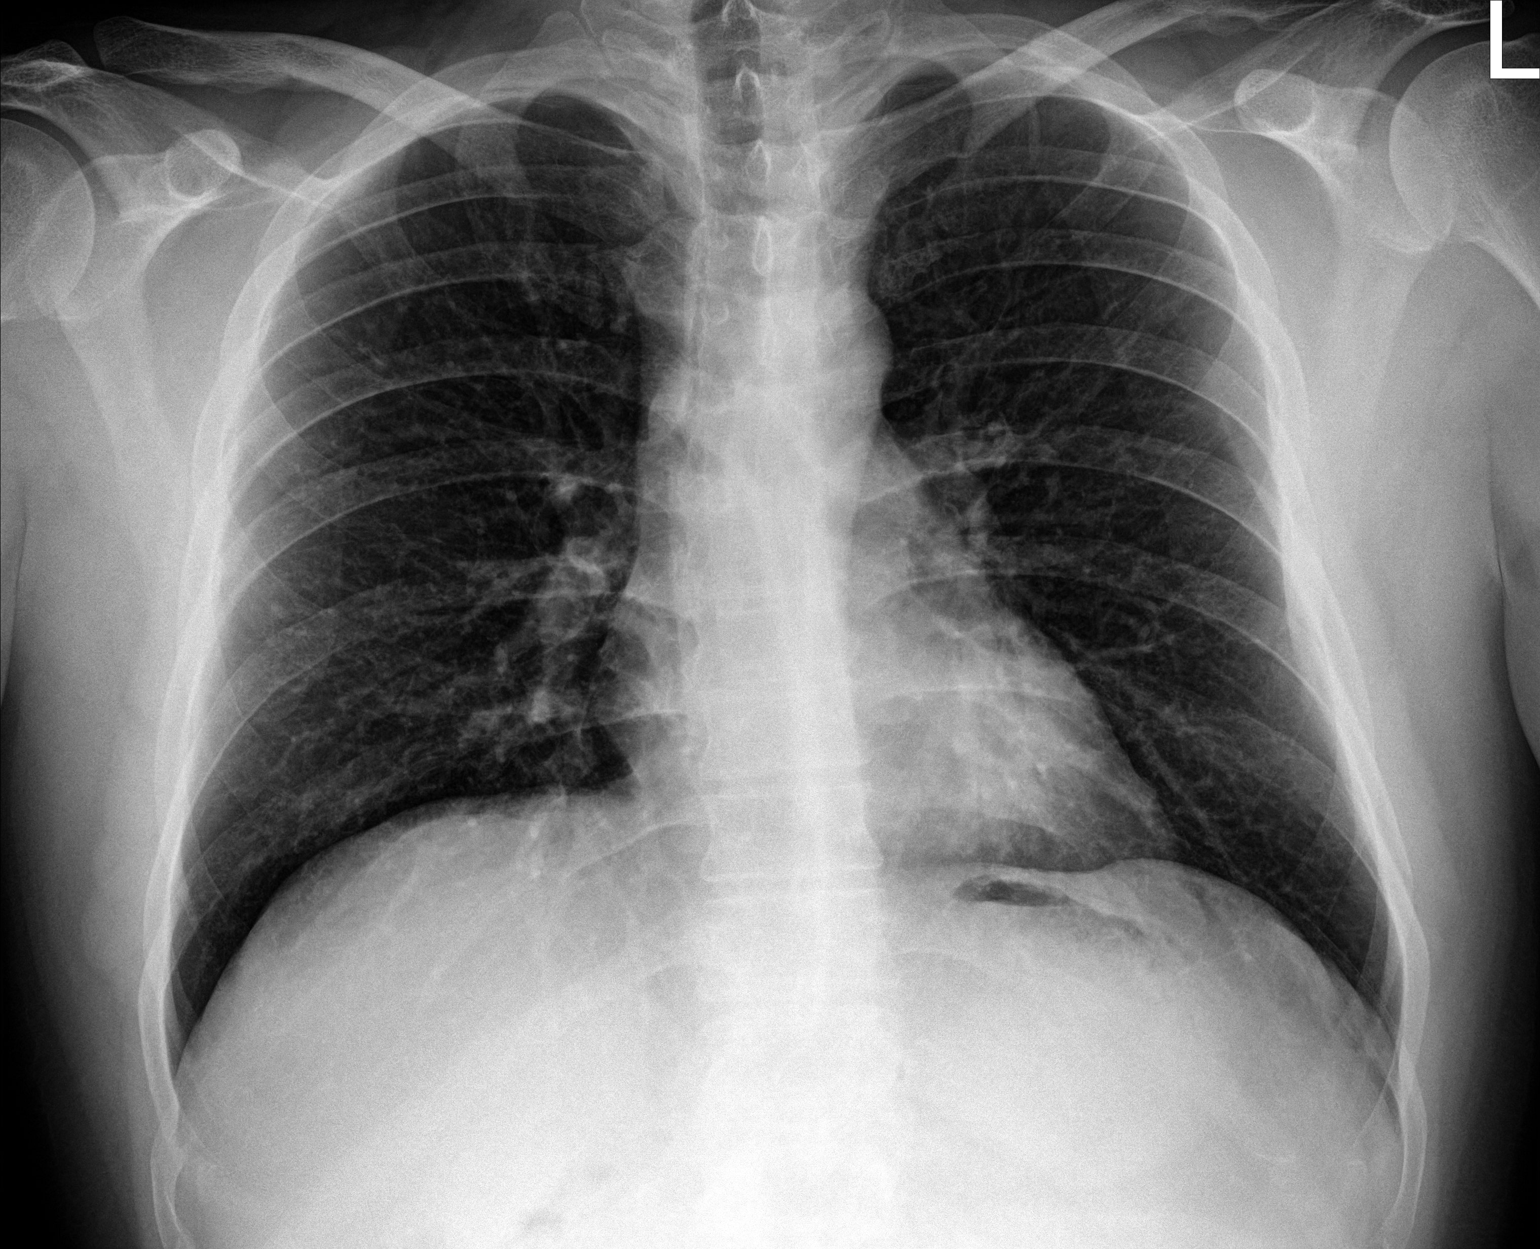

[chest lat]
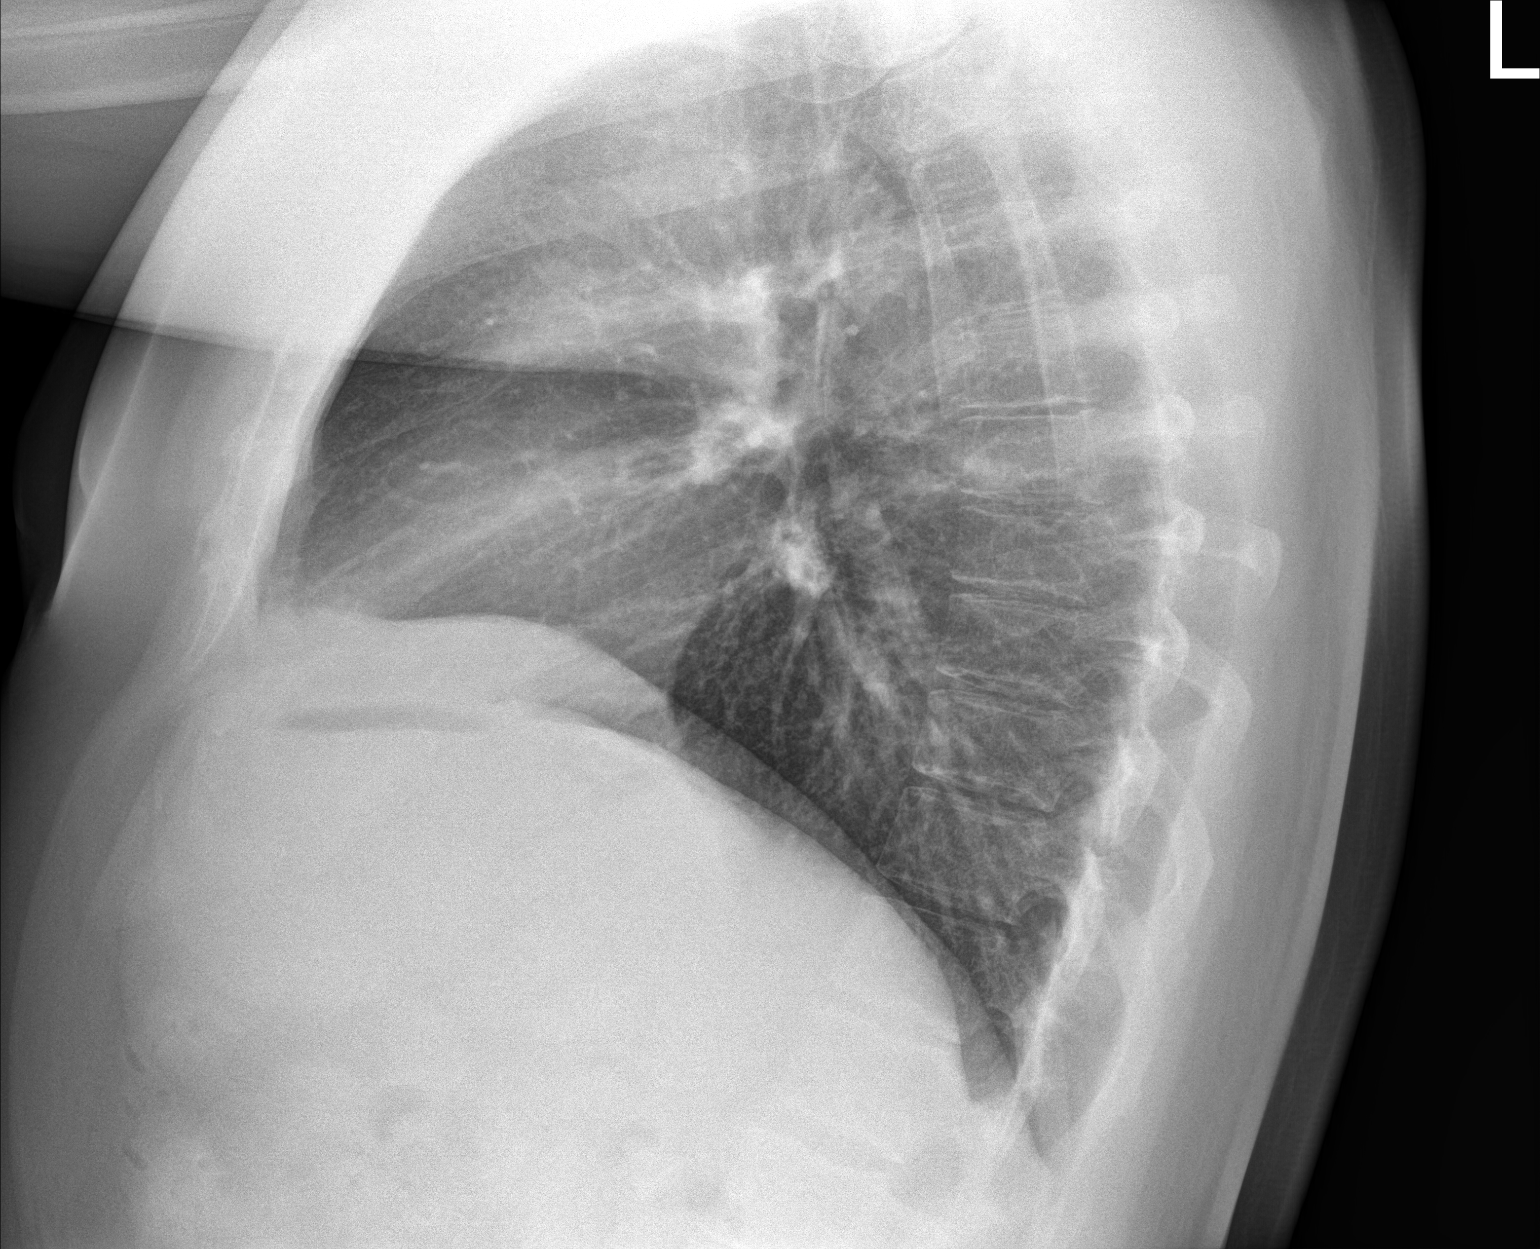

[2 of 2 positions shown; findings below may reference images not displayed]

FINDINGS: The heart size and mediastinal contours are within normal limits.
Both lungs are clear. The visualized skeletal structures are
unremarkable.
IMPRESSION: Negative.  No active cardiopulmonary disease.

## 2019-09-27 ENCOUNTER — Other Ambulatory Visit: Payer: Self-pay | Admitting: Physician Assistant

## 2019-09-27 DIAGNOSIS — E876 Hypokalemia: Secondary | ICD-10-CM

## 2019-09-28 ENCOUNTER — Other Ambulatory Visit: Payer: Self-pay | Admitting: *Deleted

## 2019-09-28 DIAGNOSIS — E876 Hypokalemia: Secondary | ICD-10-CM

## 2019-09-28 MED ORDER — POTASSIUM CHLORIDE CRYS ER 20 MEQ PO TBCR: 20.0000 meq | EXTENDED_RELEASE_TABLET | Freq: Every day | ORAL | 2 refills | Status: DC

## 2019-09-28 NOTE — Telephone Encounter (Signed)
OV 07/11/20 rtc 6 mos

## 2020-01-17 ENCOUNTER — Ambulatory Visit: Payer: BC Managed Care – PPO | Admitting: Nurse Practitioner

## 2020-01-17 ENCOUNTER — Encounter: Payer: Self-pay | Admitting: Nurse Practitioner

## 2020-01-17 ENCOUNTER — Other Ambulatory Visit: Payer: Self-pay

## 2020-01-17 VITALS — BP 135/80 | HR 93 | Temp 98.1°F | Resp 20 | Ht 70.0 in | Wt 228.0 lb

## 2020-01-17 DIAGNOSIS — F419 Anxiety disorder, unspecified: Secondary | ICD-10-CM | POA: Diagnosis not present

## 2020-01-17 DIAGNOSIS — I8393 Asymptomatic varicose veins of bilateral lower extremities: Secondary | ICD-10-CM | POA: Diagnosis not present

## 2020-01-17 DIAGNOSIS — Z6832 Body mass index (BMI) 32.0-32.9, adult: Secondary | ICD-10-CM

## 2020-01-17 DIAGNOSIS — E0590 Thyrotoxicosis, unspecified without thyrotoxic crisis or storm: Secondary | ICD-10-CM

## 2020-01-17 DIAGNOSIS — E876 Hypokalemia: Secondary | ICD-10-CM | POA: Diagnosis not present

## 2020-01-17 DIAGNOSIS — Z6833 Body mass index (BMI) 33.0-33.9, adult: Secondary | ICD-10-CM | POA: Insufficient documentation

## 2020-01-17 MED ORDER — METHIMAZOLE 5 MG PO TABS: ORAL_TABLET | ORAL | 0 refills | Status: DC

## 2020-01-17 MED ORDER — POTASSIUM CHLORIDE CRYS ER 20 MEQ PO TBCR: 20.0000 meq | EXTENDED_RELEASE_TABLET | Freq: Every day | ORAL | 2 refills | Status: DC

## 2020-01-17 MED ORDER — ALPRAZOLAM 0.5 MG PO TABS: 0.5000 mg | ORAL_TABLET | Freq: Two times a day (BID) | ORAL | 5 refills | Status: DC | PRN

## 2020-01-17 NOTE — Patient Instructions (Signed)
Stress, Adult °Stress is a normal reaction to life events. Stress is what you feel when life demands more than you are used to, or more than you think you can handle. Some stress can be useful, such as studying for a test or meeting a deadline at work. Stress that occurs too often or for too long can cause problems. It can affect your emotional health and interfere with relationships and normal daily activities. Too much stress can weaken your body's defense system (immune system) and increase your risk for physical illness. If you already have a medical problem, stress can make it worse. °What are the causes? °All sorts of life events can cause stress. An event that causes stress for one person may not be stressful for another person. Major life events, whether positive or negative, commonly cause stress. Examples include: °· Losing a job or starting a new job. °· Losing a loved one. °· Moving to a new town or home. °· Getting married or divorced. °· Having a baby. °· Getting injured or sick. °Less obvious life events can also cause stress, especially if they occur day after day or in combination with each other. Examples include: °· Working long hours. °· Driving in traffic. °· Caring for children. °· Being in debt. °· Being in a difficult relationship. °What are the signs or symptoms? °Stress can cause emotional symptoms, including: °· Anxiety. This is feeling worried, afraid, on edge, overwhelmed, or out of control. °· Anger, including irritation or impatience. °· Depression. This is feeling sad, down, helpless, or guilty. °· Trouble focusing, remembering, or making decisions. °Stress can cause physical symptoms, including: °· Aches and pains. These may affect your head, neck, back, stomach, or other areas of your body. °· Tight muscles or a clenched jaw. °· Low energy. °· Trouble sleeping. °Stress can cause unhealthy behaviors, including: °· Eating to feel better (overeating) or skipping meals. °· Working too  much or putting off tasks. °· Smoking, drinking alcohol, or using drugs to feel better. °How is this diagnosed? °Stress is diagnosed through an assessment by your health care provider. He or she may diagnose this condition based on: °· Your symptoms and any stressful life events. °· Your medical history. °· Tests to rule out other causes of your symptoms. °Depending on your condition, your health care provider may refer you to a specialist for further evaluation. °How is this treated? ° °Stress management techniques are the recommended treatment for stress. Medicine is not typically recommended for the treatment of stress. °Techniques to reduce your reaction to stressful life events include: °· Stress identification. Monitor yourself for symptoms of stress and identify what causes stress for you. These skills may help you to avoid or prepare for stressful events. °· Time management. Set your priorities, keep a calendar of events, and learn to say no. Taking these actions can help you avoid making too many commitments. °Techniques for coping with stress include: °· Rethinking the problem. Try to think realistically about stressful events rather than ignoring them or overreacting. Try to find the positives in a stressful situation rather than focusing on the negatives. °· Exercise. Physical exercise can release both physical and emotional tension. The key is to find a form of exercise that you enjoy and do it regularly. °· Relaxation techniques. These relax the body and mind. The key is to find one or more that you enjoy and use the techniques regularly. Examples include: °? Meditation, deep breathing, or progressive relaxation techniques. °? Yoga or   tai chi. ? Biofeedback, mindfulness techniques, or journaling. ? Listening to music, being out in nature, or participating in other hobbies.  Practicing a healthy lifestyle. Eat a balanced diet, drink plenty of water, limit or avoid caffeine, and get plenty of  sleep.  Having a strong support network. Spend time with family, friends, or other people you enjoy being around. Express your feelings and talk things over with someone you trust. Counseling or talk therapy with a mental health professional may be helpful if you are having trouble managing stress on your own. Follow these instructions at home: Lifestyle   Avoid drugs.  Do not use any products that contain nicotine or tobacco, such as cigarettes, e-cigarettes, and chewing tobacco. If you need help quitting, ask your health care provider.  Limit alcohol intake to no more than 1 drink a day for nonpregnant women and 2 drinks a day for men. One drink equals 12 oz of beer, 5 oz of wine, or 1 oz of hard liquor  Do not use alcohol or drugs to relax.  Eat a balanced diet that includes fresh fruits and vegetables, whole grains, lean meats, fish, eggs, and beans, and low-fat dairy. Avoid processed foods and foods high in added fat, sugar, and salt.  Exercise at least 30 minutes on 5 or more days each week.  Get 7-8 hours of sleep each night. General instructions   Practice stress management techniques as discussed with your health care provider.  Drink enough fluid to keep your urine clear or pale yellow.  Take over-the-counter and prescription medicines only as told by your health care provider.  Keep all follow-up visits as told by your health care provider. This is important. Contact a health care provider if:  Your symptoms get worse.  You have new symptoms.  You feel overwhelmed by your problems and can no longer manage them on your own. Get help right away if:  You have thoughts of hurting yourself or others. If you ever feel like you may hurt yourself or others, or have thoughts about taking your own life, get help right away. You can go to your nearest emergency department or call:  Your local emergency services (911 in the [place]  A suicide crisis helpline, such as the  [facility] at 507-293-2218. This is open 24 hours a day. Summary  Stress is a normal reaction to life events. It can cause problems if it happens too often or for too long.  Practicing stress management techniques is the best way to treat stress.  Counseling or talk therapy with a mental health professional may be helpful if you are having trouble managing stress on your own. This information is not intended to replace advice given to you by your health care provider. Make sure you discuss any questions you have with your health care provider. Document Revised: 03/04/2019 Document Reviewed: 09/24/2016 Elsevier Patient Education  [place].

## 2020-01-17 NOTE — Progress Notes (Signed)
Subjective:    Patient ID: Christian Fowler, male    DOB: 11-May-1986, 34 y.o.   MRN: [id number]   Chief Complaint: Medical Management of Chronic Issues    HPI:  1. Hyperthyroidism He is currently on methamazole for 14 year. si doing well. Denies any tachycardia.  Lab Results  Component Value Date   TSH 1.320 06/01/2018   T4TOTAL 7.3 06/01/2018    2. Hypokalemia No c/o lower ext cramping. Takes potasium daily  3. Anxiety disorder, unspecified type Is on xanax daily to BID. He gets very anxious when he is around people. GAD 7 : Generalized Anxiety Score 01/17/2020 12/01/2018  Nervous, Anxious, on Edge 1 3  Control/stop worrying 1 2  Worry too much - different things 1 2  Trouble relaxing 1 1  Restless 0 2  Easily annoyed or irritable 0 1  Afraid - awful might happen 0 2  Total GAD 7 Score 4 13      4. Asymptomatic varicose veins of both lower extremities No problems. Really do not bother him.  5. BMI 33.0-33.9,adult Weight is down 8 lb from previous Wt Readings from Last 3 Encounters:  01/17/20 228 lb (103.4 kg)  06/13/19 236 lb (107 kg)  06/01/18 238 lb (108 kg)   BMI Readings from Last 3 Encounters:  01/17/20 32.71 kg/m  06/13/19 33.86 kg/m  06/01/18 34.15 kg/m       Outpatient Encounter Medications as of 01/17/2020  Medication Sig  . ALPRAZolam (XANAX) 0.5 MG tablet Take 1 tablet (0.5 mg total) by mouth 2 (two) times daily as needed. for anxiety  . methimazole (TAPAZOLE) 5 MG tablet TAKE ONE TABLET (5 MG) BY MOUTH EVERY OTHER DAY ALTERNATING  WITH  2  TABLETS  EVERY  OTHER  DAY  . Multiple Vitamin (MULTIVITAMIN) tablet Take 1 tablet by mouth daily.  . potassium chloride SA (KLOR-CON M20) 20 MEQ tablet Take 1 tablet (20 mEq total) by mouth daily.       New complaints: None today     Controlled substance contract: 01/17/20     Review of Systems  Constitutional: Negative for diaphoresis.  Eyes: Negative for pain.  Respiratory: Negative for  shortness of breath.   Cardiovascular: Negative for chest pain, palpitations and leg swelling.  Gastrointestinal: Negative for abdominal pain.  Endocrine: Negative for polydipsia.  Skin: Negative for rash.  Neurological: Negative for dizziness, weakness and headaches.  Hematological: Does not bruise/bleed easily.  All other systems reviewed and are negative.      Objective:   Physical Exam Vitals and nursing note reviewed.  Constitutional:      Appearance: Normal appearance. He is well-developed.  HENT:     Head: Normocephalic.     Nose: Nose normal.  Eyes:     Pupils: Pupils are equal, round, and reactive to light.  Neck:     Thyroid: No thyroid mass or thyromegaly.     Vascular: No carotid bruit or JVD.     Trachea: Phonation normal.  Cardiovascular:     Rate and Rhythm: Normal rate and regular rhythm.  Pulmonary:     Effort: Pulmonary effort is normal. No respiratory distress.     Breath sounds: Normal breath sounds.  Abdominal:     General: Bowel sounds are normal.     Palpations: Abdomen is soft.     Tenderness: There is no abdominal tenderness.  Musculoskeletal:        General: Normal range of motion.     Cervical  back: Normal range of motion and neck supple.  Lymphadenopathy:     Cervical: No cervical adenopathy.  Skin:    General: Skin is warm and dry.  Neurological:     Mental Status: He is alert and oriented to person, place, and time.  Psychiatric:        Behavior: Behavior normal.        Thought Content: Thought content normal.        Judgment: Judgment normal.    Blood pressure 135/80, pulse 93, temperature 98.1 F (36.7 C), temperature source Temporal, resp. rate 20, height '5\' 10"'  (1.778 m), weight 228 lb (103.4 kg), SpO2 96 %.        Assessment & Plan:  Colston Pyle comes in today with chief complaint of Medical Management of Chronic Issues   Diagnosis and orders addressed:  1. Hyperthyroidism Report any heart racing - methimazole  (TAPAZOLE) 5 MG tablet; TAKE ONE TABLET (5 MG) BY MOUTH EVERY OTHER DAY ALTERNATING  WITH  2  TABLETS  EVERY  OTHER  DAY  Dispense: 45 tablet; Refill: 0 - CBC with Differential/Platelet - CMP14+EGFR - Lipid panel - Thyroid Panel With TSH  2. Hypokalemia Labs pending - potassium chloride SA (KLOR-CON M20) 20 MEQ tablet; Take 1 tablet (20 mEq total) by mouth daily.  Dispense: 30 tablet; Refill: 2  3. Anxiety disorder, unspecified type stress management - ALPRAZolam (XANAX) 0.5 MG tablet; Take 1 tablet (0.5 mg total) by mouth 2 (two) times daily as needed. for anxiety  Dispense: 30 tablet; Refill: 5 - Nicotine/cotinine metabolites  4. Asymptomatic varicose veins of both lower extremities Compression socks when working  5. BMI 32.0-32.9,adult Discussed diet and exercise for person with BMI >25 Will recheck weight in 3-6 months    Labs pending Health Maintenance reviewed Diet and exercise encouraged  Follow up plan: 6 month   [place], FNP

## 2020-01-24 LAB — CMP14+EGFR
ALT: 39 IU/L (ref 0–44)
AST: 23 IU/L (ref 0–40)
Albumin/Globulin Ratio: 1.8 (ref 1.2–2.2)
Albumin: 4.9 g/dL (ref 4.0–5.0)
Alkaline Phosphatase: 77 IU/L (ref 48–121)
BUN/Creatinine Ratio: 14 (ref 9–20)
BUN: 12 mg/dL (ref 6–20)
Bilirubin Total: 0.6 mg/dL (ref 0.0–1.2)
CO2: 23 mmol/L (ref 20–29)
Calcium: 9.5 mg/dL (ref 8.7–10.2)
Chloride: 101 mmol/L (ref 96–106)
Creatinine, Ser: 0.86 mg/dL (ref 0.76–1.27)
GFR calc Af Amer: 134 mL/min/{1.73_m2} (ref >59)
GFR calc non Af Amer: 116 mL/min/{1.73_m2} (ref >59)
Globulin, Total: 2.7 g/dL (ref 1.5–4.5)
Glucose: 71 mg/dL (ref 65–99)
Potassium: 3.7 mmol/L (ref 3.5–5.2)
Sodium: 138 mmol/L (ref 134–144)
Total Protein: 7.6 g/dL (ref 6.0–8.5)

## 2020-01-24 LAB — NICOTINE/COTININE METABOLITES
Cotinine: <1 ng/mL
Nicotine: <1 ng/mL

## 2020-01-24 LAB — CBC WITH DIFFERENTIAL/PLATELET
Basophils Absolute: 0.1 10*3/uL (ref 0.0–0.2)
Basos: 1 %
EOS (ABSOLUTE): 0.2 10*3/uL (ref 0.0–0.4)
Eos: 2 %
Hematocrit: 50.7 % (ref 37.5–51.0)
Hemoglobin: 16.9 g/dL (ref 13.0–17.7)
Immature Grans (Abs): 0 10*3/uL (ref 0.0–0.1)
Immature Granulocytes: 0 %
Lymphocytes Absolute: 2.1 10*3/uL (ref 0.7–3.1)
Lymphs: 28 %
MCH: 28.4 pg (ref 26.6–33.0)
MCHC: 33.3 g/dL (ref 31.5–35.7)
MCV: 85 fL (ref 79–97)
Monocytes Absolute: 0.5 10*3/uL (ref 0.1–0.9)
Monocytes: 7 %
Neutrophils Absolute: 4.6 10*3/uL (ref 1.4–7.0)
Neutrophils: 62 %
Platelets: 288 10*3/uL (ref 150–450)
RBC: 5.95 x10E6/uL — ABNORMAL HIGH (ref 4.14–5.80)
RDW: 13.6 % (ref 11.6–15.4)
WBC: 7.5 10*3/uL (ref 3.4–10.8)

## 2020-01-24 LAB — THYROID PANEL WITH TSH
Free Thyroxine Index: 2.4 (ref 1.2–4.9)
T3 Uptake Ratio: 28 % (ref 24–39)
T4, Total: 8.6 ug/dL (ref 4.5–12.0)
TSH: 2.52 u[IU]/mL (ref 0.450–4.500)

## 2020-01-24 LAB — LIPID PANEL
Chol/HDL Ratio: 6 ratio — ABNORMAL HIGH (ref 0.0–5.0)
Cholesterol, Total: 216 mg/dL — ABNORMAL HIGH (ref 100–199)
HDL: 36 mg/dL — ABNORMAL LOW (ref >39)
LDL Chol Calc (NIH): 165 mg/dL — ABNORMAL HIGH (ref 0–99)
Triglycerides: 83 mg/dL (ref 0–149)
VLDL Cholesterol Cal: 15 mg/dL (ref 5–40)

## 2020-02-14 ENCOUNTER — Other Ambulatory Visit: Payer: Self-pay | Admitting: Nurse Practitioner

## 2020-02-14 ENCOUNTER — Telehealth: Payer: Self-pay | Admitting: Physician Assistant

## 2020-02-14 DIAGNOSIS — E0590 Thyrotoxicosis, unspecified without thyrotoxic crisis or storm: Secondary | ICD-10-CM

## 2020-02-14 MED ORDER — METHIMAZOLE 5 MG PO TABS: ORAL_TABLET | ORAL | 3 refills | Status: DC

## 2020-02-14 NOTE — Telephone Encounter (Signed)
Refills ordered.

## 2020-02-14 NOTE — Telephone Encounter (Signed)
Please advise.  Appointment on 01/17/2020, prescription given for #45 with no refills on same day.

## 2020-02-14 NOTE — Telephone Encounter (Signed)
There are no refills when you look at prescription list in chart review?

## 2020-02-14 NOTE — Telephone Encounter (Signed)
According to chart he was iven 5 refills on 01/17/20. It is probably just to earlier to refill.

## 2020-06-07 DIAGNOSIS — M79675 Pain in left toe(s): Secondary | ICD-10-CM | POA: Diagnosis not present

## 2020-06-21 DIAGNOSIS — L03032 Cellulitis of left toe: Secondary | ICD-10-CM | POA: Diagnosis not present

## 2020-06-21 DIAGNOSIS — M79675 Pain in left toe(s): Secondary | ICD-10-CM | POA: Diagnosis not present

## 2020-07-18 ENCOUNTER — Ambulatory Visit: Payer: BC Managed Care – PPO | Admitting: Nurse Practitioner

## 2020-07-18 ENCOUNTER — Other Ambulatory Visit: Payer: BC Managed Care – PPO

## 2020-07-18 ENCOUNTER — Encounter: Payer: Self-pay | Admitting: Nurse Practitioner

## 2020-07-18 ENCOUNTER — Other Ambulatory Visit: Payer: Self-pay

## 2020-07-18 VITALS — BP 131/78 | HR 84 | Temp 98.2°F | Resp 20 | Ht 70.0 in | Wt 230.0 lb

## 2020-07-18 DIAGNOSIS — Z6833 Body mass index (BMI) 33.0-33.9, adult: Secondary | ICD-10-CM

## 2020-07-18 DIAGNOSIS — F419 Anxiety disorder, unspecified: Secondary | ICD-10-CM | POA: Diagnosis not present

## 2020-07-18 DIAGNOSIS — E876 Hypokalemia: Secondary | ICD-10-CM

## 2020-07-18 DIAGNOSIS — I8393 Asymptomatic varicose veins of bilateral lower extremities: Secondary | ICD-10-CM

## 2020-07-18 DIAGNOSIS — F5101 Primary insomnia: Secondary | ICD-10-CM

## 2020-07-18 DIAGNOSIS — E0590 Thyrotoxicosis, unspecified without thyrotoxic crisis or storm: Secondary | ICD-10-CM | POA: Diagnosis not present

## 2020-07-18 MED ORDER — METHIMAZOLE 5 MG PO TABS: ORAL_TABLET | ORAL | 5 refills | Status: DC

## 2020-07-18 MED ORDER — ALPRAZOLAM 0.5 MG PO TABS: 0.5000 mg | ORAL_TABLET | Freq: Two times a day (BID) | ORAL | 5 refills | Status: DC | PRN

## 2020-07-18 MED ORDER — POTASSIUM CHLORIDE CRYS ER 20 MEQ PO TBCR: 20.0000 meq | EXTENDED_RELEASE_TABLET | Freq: Every day | ORAL | 5 refills | Status: DC

## 2020-07-18 MED ORDER — ZOLPIDEM TARTRATE 5 MG PO TABS: 5.0000 mg | ORAL_TABLET | Freq: Every evening | ORAL | 1 refills | Status: DC | PRN

## 2020-07-18 NOTE — Patient Instructions (Signed)

## 2020-07-18 NOTE — Progress Notes (Signed)
Subjective:    Patient ID: Christian Fowler, male    DOB: Jan 14, 1986, 34 y.o.   MRN: [id number]   Chief Complaint: No chief complaint on file.    HPI:  1. Anxiety disorder, unspecified type Takes xanax. Tries to use just once a dayand is usually in the mornings. If he is at home, he tries not to take one at all. If he is going out into a crowd he does take one before he goes out because crowds cause a lot of anxiety. He says he has had a few episodes where he has felt lightheaded. This tends to happen more often when he is with other people. He does have some breathing exercises that he tries to do when he gets too anxious which does help. Also worries about his son because he has a rare medical condition. He also has an uncle who had a motorcycle accident recently and is still in the ICU. Has trouble falling asleep at night and wakes up before his alarm. GAD 7 : Generalized Anxiety Score 01/17/2020 12/01/2018  Nervous, Anxious, on Edge 1 3  Control/stop worrying 1 2  Worry too much - different things 1 2  Trouble relaxing 1 1  Restless 0 2  Easily annoyed or irritable 0 1  Afraid - awful might happen 0 2  Total GAD 7 Score 4 13    2. Hyperthyroidism Denies racing heartbeat, tremors, or any other symptoms of hyperthyroidism to his knowledge. Takes methimazole daily. Lab Results  Component Value Date   TSH 2.520 01/17/2020    3. Hypokalemia Takes potassium chloride every other day. Denies cramping in lower extremities. Lab Results  Component Value Date   K 3.7 01/17/2020    4. Asymptomatic varicose veins of both lower extremities Wears compression socks daily but they do not really bother him.  5. BMI 33.0-33.9,adult No significant changes in weight. Wt Readings from Last 3 Encounters:  01/17/20 228 lb (103.4 kg)  06/13/19 236 lb (107 kg)  06/01/18 238 lb (108 kg)   BMI Readings from Last 3 Encounters:  01/17/20 32.71 kg/m  06/13/19 33.86 kg/m  06/01/18 34.15 kg/m      Outpatient Encounter Medications as of 07/18/2020  Medication Sig  . ALPRAZolam (XANAX) 0.5 MG tablet Take 1 tablet (0.5 mg total) by mouth 2 (two) times daily as needed. for anxiety  . methimazole (TAPAZOLE) 5 MG tablet TAKE ONE TABLET (5 MG) BY MOUTH EVERY OTHER DAY ALTERNATING  WITH  2  TABLETS  EVERY  OTHER  DAY  . Multiple Vitamin (MULTIVITAMIN) tablet Take 1 tablet by mouth daily.  . potassium chloride SA (KLOR-CON M20) 20 MEQ tablet Take 1 tablet (20 mEq total) by mouth daily.   No facility-administered encounter medications on file as of 07/18/2020.    No past surgical history on file.  No family history on file.  New complaints: Trouble sleeping as stated above. He takes 2 zyquil at night and still does not sleep all night. Melatonin is no help..  Social history: Son lives with him most of the time.  Controlled substance contract: 01/17/20   Review of Systems  Constitutional: Negative.   HENT: Negative.   Eyes: Negative.   Respiratory: Negative.   Cardiovascular: Negative.   Gastrointestinal: Negative.   Genitourinary: Negative.   Musculoskeletal: Negative.   Skin: Negative.   Neurological: Positive for light-headedness (occasionally).  Psychiatric/Behavioral: Positive for sleep disturbance (insomnia). The patient is nervous/anxious.        Objective:  Physical Exam  BP 131/78   Pulse 84   Temp 98.2 F (36.8 C) (Temporal)   Resp 20   Ht '5\' 10"'  (1.778 m)   Wt 230 lb (104.3 kg)   SpO2 97%   BMI 33.00 kg/m      Assessment & Plan:  Christian Fowler comes in today with chief complaint of Medical Management of Chronic Issues   Diagnosis and orders addressed:  1. Anxiety disorder, unspecified type Stress management - ALPRAZolam (XANAX) 0.5 MG tablet; Take 1 tablet (0.5 mg total) by mouth 2 (two) times daily as needed. for anxiety  Dispense: 30 tablet; Refill: 5  2. Hyperthyroidism - methimazole (TAPAZOLE) 5 MG tablet; TAKE ONE TABLET (5 MG) BY  MOUTH EVERY OTHER DAY ALTERNATING  WITH  2  TABLETS  EVERY  OTHER  DAY  Dispense: 45 tablet; Refill: 5 - Thyroid Panel With TSH  3. Hypokalemia Labs pending - potassium chloride SA (KLOR-CON M20) 20 MEQ tablet; Take 1 tablet (20 mEq total) by mouth daily.  Dispense: 30 tablet; Refill: 5 - CBC with Differential/Platelet - CMP14+EGFR  4. Asymptomatic varicose veins of both lower extremities Compress socks will help  5. BMI 33.0-33.9,adult Discussed diet and exercise for person with BMI >25 Will recheck weight in 3-6 months  6. Primary insomnia Bedtime routine - zolpidem (AMBIEN) 5 MG tablet; Take 1 tablet (5 mg total) by mouth at bedtime as needed for sleep.  Dispense: 15 tablet; Refill: 1   Labs pending Health Maintenance reviewed Diet and exercise encouraged  Follow up plan: 6 months   Mary-Margaret Hassell Done, FNP

## 2020-07-19 LAB — CMP14+EGFR
ALT: 29 IU/L (ref 0–44)
AST: 20 IU/L (ref 0–40)
Albumin/Globulin Ratio: 2.2 (ref 1.2–2.2)
Albumin: 4.9 g/dL (ref 4.0–5.0)
Alkaline Phosphatase: 66 IU/L (ref 44–121)
BUN/Creatinine Ratio: 9 (ref 9–20)
BUN: 9 mg/dL (ref 6–20)
Bilirubin Total: 0.6 mg/dL (ref 0.0–1.2)
CO2: 25 mmol/L (ref 20–29)
Calcium: 9.3 mg/dL (ref 8.7–10.2)
Chloride: 105 mmol/L (ref 96–106)
Creatinine, Ser: 0.95 mg/dL (ref 0.76–1.27)
GFR calc Af Amer: 122 mL/min/{1.73_m2} (ref >59)
GFR calc non Af Amer: 105 mL/min/{1.73_m2} (ref >59)
Globulin, Total: 2.2 g/dL (ref 1.5–4.5)
Glucose: 91 mg/dL (ref 65–99)
Potassium: 4 mmol/L (ref 3.5–5.2)
Sodium: 142 mmol/L (ref 134–144)
Total Protein: 7.1 g/dL (ref 6.0–8.5)

## 2020-07-19 LAB — CBC WITH DIFFERENTIAL/PLATELET
Basophils Absolute: 0.1 10*3/uL (ref 0.0–0.2)
Basos: 1 %
EOS (ABSOLUTE): 0.1 10*3/uL (ref 0.0–0.4)
Eos: 2 %
Hematocrit: 51.5 % — ABNORMAL HIGH (ref 37.5–51.0)
Hemoglobin: 17 g/dL (ref 13.0–17.7)
Immature Grans (Abs): 0 10*3/uL (ref 0.0–0.1)
Immature Granulocytes: 0 %
Lymphocytes Absolute: 1.9 10*3/uL (ref 0.7–3.1)
Lymphs: 34 %
MCH: 27.6 pg (ref 26.6–33.0)
MCHC: 33 g/dL (ref 31.5–35.7)
MCV: 84 fL (ref 79–97)
Monocytes Absolute: 0.6 10*3/uL (ref 0.1–0.9)
Monocytes: 10 %
Neutrophils Absolute: 2.8 10*3/uL (ref 1.4–7.0)
Neutrophils: 53 %
Platelets: 302 10*3/uL (ref 150–450)
RBC: 6.15 x10E6/uL — ABNORMAL HIGH (ref 4.14–5.80)
RDW: 13.6 % (ref 11.6–15.4)
WBC: 5.5 10*3/uL (ref 3.4–10.8)

## 2020-07-19 LAB — THYROID PANEL WITH TSH
Free Thyroxine Index: 1.6 (ref 1.2–4.9)
T3 Uptake Ratio: 24 % (ref 24–39)
T4, Total: 6.5 ug/dL (ref 4.5–12.0)
TSH: 3.08 u[IU]/mL (ref 0.450–4.500)

## 2020-07-20 ENCOUNTER — Telehealth: Payer: Self-pay

## 2020-07-20 NOTE — Telephone Encounter (Signed)
FYI: Corry returned missed call from [facility] regarding question about new Ambien Rx.  Pt stated that last night was his first night taking the medicine and says it did help him go to sleep, and he stayed asleep for a while, but says he was still up this morning before his alarm clock ever went off. Wanted to know if that is a sign that his dosage needs to be increased or if he needs to allow more time for medicine to get in his system.Marland Kitchen  Spoke with nurse Almira Coaster about pt's issue.  Almira Coaster advised pt that he needs to continue to take Ambien as directed for at least a week and to call back if medicine isn't working.  Pt voiced understanding.

## 2020-07-23 ENCOUNTER — Telehealth: Payer: Self-pay | Admitting: Family Medicine

## 2020-07-23 NOTE — Telephone Encounter (Signed)
Started on [place] 5mg  dose- he may take 2 tablets tonight and see if that works better for him. If  So we will change prescription.

## 2020-07-31 NOTE — Telephone Encounter (Signed)
Lmtcb 1/26

## 2020-08-02 NOTE — Telephone Encounter (Signed)
Advised patient that per Christian Fowler, the may take two of the 5 mg Ambien and see if that helps.  Asked him to call us back and let us know if this helped so we could increase his prescription if it did.

## 2020-08-02 NOTE — Telephone Encounter (Signed)
Pt is calling and said he only has one ambien left

## 2020-08-02 NOTE — Telephone Encounter (Signed)
Patient was given Ambien on 07/18/20 #15 R-1 and the directions where to take it as needed.  Patient states he was taking it every night and now he only has one more left.  Advised patient per instructions he was only supposed to take it prn.  States that otc medication was not working and even with the Ambien it is not giving him full rest but it is better than the otc.

## 2020-08-02 NOTE — Telephone Encounter (Signed)
  Prescription Request  08/02/2020  What is the name of the medication or equipment? Pt wants to increase his ambien because 5 mg is not working  Have you contacted your pharmacy to request a refill? (if applicable) no  Which pharmacy would you like this sent to? [facility]   Patient notified that their request is being sent to the clinical staff for review and that they should receive a response within 2 business days.

## 2020-08-03 ENCOUNTER — Other Ambulatory Visit: Payer: Self-pay | Admitting: Nurse Practitioner

## 2020-08-03 DIAGNOSIS — F5101 Primary insomnia: Secondary | ICD-10-CM

## 2020-08-03 MED ORDER — ZOLPIDEM TARTRATE 5 MG PO TABS: 5.0000 mg | ORAL_TABLET | Freq: Every evening | ORAL | 1 refills | Status: DC | PRN

## 2020-08-03 NOTE — Telephone Encounter (Signed)
lmtcb

## 2020-08-03 NOTE — Telephone Encounter (Signed)
It is fine for him to take nightly if he needs it. I will change prescription.

## 2020-08-08 NOTE — Telephone Encounter (Signed)
Multiple attempts made to contact patient.  This encounter will now be closed  

## 2020-08-08 NOTE — Telephone Encounter (Signed)
Multiple attempts made to contact patient this encounter will now be closed.  

## 2020-08-19 DIAGNOSIS — R430 Anosmia: Secondary | ICD-10-CM | POA: Diagnosis not present

## 2020-08-19 DIAGNOSIS — J029 Acute pharyngitis, unspecified: Secondary | ICD-10-CM | POA: Diagnosis not present

## 2020-08-19 DIAGNOSIS — R432 Parageusia: Secondary | ICD-10-CM | POA: Diagnosis not present

## 2020-08-19 DIAGNOSIS — R059 Cough, unspecified: Secondary | ICD-10-CM | POA: Diagnosis not present

## 2021-01-17 ENCOUNTER — Ambulatory Visit: Payer: BLUE CROSS/BLUE SHIELD | Admitting: Nurse Practitioner

## 2021-01-17 ENCOUNTER — Encounter: Payer: Self-pay | Admitting: Nurse Practitioner

## 2021-01-17 ENCOUNTER — Other Ambulatory Visit: Payer: Self-pay

## 2021-01-17 VITALS — BP 122/69 | HR 88 | Temp 98.5°F | Resp 20 | Ht 70.0 in | Wt 230.0 lb

## 2021-01-17 DIAGNOSIS — E876 Hypokalemia: Secondary | ICD-10-CM | POA: Diagnosis not present

## 2021-01-17 DIAGNOSIS — I8393 Asymptomatic varicose veins of bilateral lower extremities: Secondary | ICD-10-CM | POA: Diagnosis not present

## 2021-01-17 DIAGNOSIS — E0590 Thyrotoxicosis, unspecified without thyrotoxic crisis or storm: Secondary | ICD-10-CM

## 2021-01-17 DIAGNOSIS — F419 Anxiety disorder, unspecified: Secondary | ICD-10-CM

## 2021-01-17 DIAGNOSIS — Z6833 Body mass index (BMI) 33.0-33.9, adult: Secondary | ICD-10-CM

## 2021-01-17 MED ORDER — POTASSIUM CHLORIDE CRYS ER 20 MEQ PO TBCR: 20.0000 meq | EXTENDED_RELEASE_TABLET | Freq: Every day | ORAL | 5 refills | Status: DC

## 2021-01-17 MED ORDER — ALPRAZOLAM 0.5 MG PO TABS: 0.5000 mg | ORAL_TABLET | Freq: Two times a day (BID) | ORAL | 5 refills | Status: DC | PRN

## 2021-01-17 MED ORDER — METHIMAZOLE 5 MG PO TABS: ORAL_TABLET | ORAL | 5 refills | Status: DC

## 2021-01-17 NOTE — Progress Notes (Signed)
Subjective:    Patient ID: Christian Fowler, male    DOB: Apr 08, 1986, 35 y.o.   MRN: [id number]   Chief Complaint: Medical Management of Chronic Issues    HPI:  1. Hyperthyroidism No problems that he is aware of. Lab Results  Component Value Date   TSH 3.080 07/18/2020     2. Hypokalemia Denies any lower ext cramps. Is on potassium supplement. Lab Results  Component Value Date   K 4.0 07/18/2020      3. Anxiety disorder, unspecified type Takes xanax 2x a day for his anxiety GAD 7 : Generalized Anxiety Score 01/17/2021 01/17/2020 12/01/2018  Nervous, Anxious, on Edge '3 1 3  ' Control/stop worrying '3 1 2  ' Worry too much - different things '3 1 2  ' Trouble relaxing '3 1 1  ' Restless 1 0 2  Easily annoyed or irritable 1 0 1  Afraid - awful might happen 2 0 2  Total GAD 7 Score '16 4 13  ' Anxiety Difficulty Very difficult - -      4. Asymptomatic varicose veins of both lower extremities Legs just hurt and feel heavy at times  5. BMI 33.0-33.9,adult No recent weight changes Wt Readings from Last 3 Encounters:  01/17/21 230 lb (104.3 kg)  07/18/20 230 lb (104.3 kg)  01/17/20 228 lb (103.4 kg)   BMI Readings from Last 3 Encounters:  01/17/21 33.00 kg/m  07/18/20 33.00 kg/m  01/17/20 32.71 kg/m   6. insomnia He has stopped his [place] and is using chamoille tea which helps him sleep better.   Outpatient Encounter Medications as of 01/17/2021  Medication Sig  . ALPRAZolam (XANAX) 0.5 MG tablet Take 1 tablet (0.5 mg total) by mouth 2 (two) times daily as needed. for anxiety  . methimazole (TAPAZOLE) 5 MG tablet TAKE ONE TABLET (5 MG) BY MOUTH EVERY OTHER DAY ALTERNATING  WITH  2  TABLETS  EVERY  OTHER  DAY  . Multiple Vitamin (MULTIVITAMIN) tablet Take 1 tablet by mouth daily.  . potassium chloride SA (KLOR-CON M20) 20 MEQ tablet Take 1 tablet (20 mEq total) by mouth daily.  Marland Kitchen zolpidem (AMBIEN) 5 MG tablet Take 1 tablet (5 mg total) by mouth at bedtime as needed for sleep.    No facility-administered encounter medications on file as of 01/17/2021.    History reviewed. No pertinent surgical history.  History reviewed. No pertinent family history.  New complaints: None today  Social history: Lives with hisself and has his son most of th etime.  Controlled substance contract: n/a    Review of Systems  Constitutional: Negative for diaphoresis.  Eyes: Negative for pain.  Respiratory: Negative for shortness of breath.   Cardiovascular: Negative for chest pain, palpitations and leg swelling.  Gastrointestinal: Negative for abdominal pain.  Endocrine: Negative for polydipsia.  Skin: Negative for rash.  Neurological: Negative for dizziness, weakness and headaches.  Hematological: Does not bruise/bleed easily.  All other systems reviewed and are negative.      Objective:   Physical Exam Vitals and nursing note reviewed.  Constitutional:      Appearance: Normal appearance. He is well-developed.  HENT:     Head: Normocephalic.     Nose: Nose normal.  Eyes:     Pupils: Pupils are equal, round, and reactive to light.  Neck:     Thyroid: No thyroid mass or thyromegaly.     Vascular: No carotid bruit or JVD.     Trachea: Phonation normal.  Cardiovascular:  Rate and Rhythm: Normal rate and regular rhythm.     Comments: Rope like varicose veins Pulmonary:     Effort: Pulmonary effort is normal. No respiratory distress.     Breath sounds: Normal breath sounds.  Abdominal:     General: Bowel sounds are normal.     Palpations: Abdomen is soft.     Tenderness: There is no abdominal tenderness.  Musculoskeletal:        General: Normal range of motion.     Cervical back: Normal range of motion and neck supple.  Lymphadenopathy:     Cervical: No cervical adenopathy.  Skin:    General: Skin is warm and dry.  Neurological:     Mental Status: He is alert and oriented to person, place, and time.  Psychiatric:        Behavior: Behavior normal.         Thought Content: Thought content normal.        Judgment: Judgment normal.    BP 122/69   Pulse 88   Temp 98.5 F (36.9 C) (Temporal)   Resp 20   Ht '5\' 10"'  (1.778 m)   Wt 230 lb (104.3 kg)   SpO2 95%   BMI 33.00 kg/m         Assessment & Plan:  Christian Fowler comes in today with chief complaint of Medical Management of Chronic Issues   Diagnosis and orders addressed:  1. Hyperthyroidism Labs pending - methimazole (TAPAZOLE) 5 MG tablet; TAKE ONE TABLET (5 MG) BY MOUTH EVERY OTHER DAY ALTERNATING  WITH  2  TABLETS  EVERY  OTHER  DAY  Dispense: 45 tablet; Refill: 5 - Thyroid Panel With TSH  2. Hypokalemia Labs pending - CBC with Differential/Platelet - CMP14+EGFR - Lipid panel - potassium chloride SA (KLOR-CON M20) 20 MEQ tablet; Take 1 tablet (20 mEq total) by mouth daily.  Dispense: 30 tablet; Refill: 5  3. Anxiety disorder, unspecified type Stress management - ALPRAZolam (XANAX) 0.5 MG tablet; Take 1 tablet (0.5 mg total) by mouth 2 (two) times daily as needed. for anxiety  Dispense: 30 tablet; Refill: 5  4. Asymptomatic varicose veins of both lower extremities Wear compression socks when working  5. BMI 33.0-33.9,adult Discussed diet and exercise for person with BMI >25 Will recheck weight in 3-6 months    Labs pending Health Maintenance reviewed Diet and exercise encouraged  Follow up plan: 6 months   Mary-Margaret Hassell Done, FNP

## 2021-01-17 NOTE — Patient Instructions (Signed)
Varicose Veins Varicose veins are veins that have become enlarged, bulged, and twisted. They most often appear in the legs. What are the causes? This condition is caused by damage to the valves in the vein. These valves help blood return to your heart. When they are damaged and they stop working properly, blood may flow backward and back up in the veins near the skin, causing the veins to get larger and appear twisted. The condition can result from any issue that causes blood to back up, like pregnancy, prolonged standing, or obesity. What increases the risk? This condition is more likely to develop in people who are:  On their feet a lot.  Pregnant.  Overweight. What are the signs or symptoms? Symptoms of this condition include:  Bulging, twisted, and bluish veins.  A feeling of heaviness. This may be worse at the end of the day.  Leg pain. This may be worse at the end of the day.  Swelling in the leg.  Changes in skin color over the veins. How is this diagnosed? This condition may be diagnosed based on your symptoms, a physical exam, and an ultrasound test. How is this treated? Treatment for this condition may involve:  Avoiding sitting or standing in one position for long periods of time.  Wearing compression stockings. These stockings help to prevent blood clots and reduce swelling in the legs.  Raising (elevating) the legs when resting.  Losing weight.  Exercising regularly. If you have persistent symptoms or want to improve the way your varicose veins look, you may choose to have a procedure to close the varicose veins off or to remove them. Treatments to close off the veins include:  Sclerotherapy. In this treatment, a solution is injected into a vein to close it off.  Laser treatment. In this treatment, the vein is heated with a laser to close it off.  Radiofrequency vein ablation. In this treatment, an electrical current produced by radio waves is used to close  off the vein. Treatments to remove the veins include:  Phlebectomy. In this treatment, the veins are removed through small incisions made over the veins.  Vein ligation and stripping. In this treatment, incisions are made over the veins. The veins are then removed after being tied (ligated) with stitches (sutures). Follow these instructions at home: Activity  Walk as much as possible. Walking increases blood flow. This helps blood return to the heart and takes pressure off your veins. It also increases your cardiovascular strength.  Follow your health care provider's instructions about exercising.  Do not stand or sit in one position for a long period of time.  Do not sit with your legs crossed.  Rest with your legs raised during the day. General instructions  Follow any diet instructions given to you by your health care provider.  Wear compression stockings as directed by your health care provider. Do not wear other kinds of tight clothing around your legs, pelvis, or waist.  Elevate your legs at night to above the level of your heart.  If you get a cut in the skin over the varicose vein and the vein bleeds: ? Lie down with your leg raised. ? Apply firm pressure to the cut with a clean cloth until the bleeding stops. ? Place a bandage (dressing) on the cut.   Contact a health care provider if:  The skin around your varicose veins starts to break down.  You have pain, redness, tenderness, or hard swelling over a vein.    You are uncomfortable because of pain.  You get a cut in the skin over a varicose vein and it will not stop bleeding. Summary  Varicose veins are veins that have become enlarged, bulged, and twisted. They most often appear in the legs.  This condition is caused by damage to the valves in the vein. These valves help blood return to your heart.  Treatment for this condition includes frequent movements, wearing compression stockings, losing weight, and  exercising regularly. In some cases, procedures are done to close off or remove the veins.  Treatment for this condition may include wearing compression stockings, elevating the legs, losing weight, and engaging in regular activity. In some cases, procedures are done to close off or remove the veins. This information is not intended to replace advice given to you by your health care provider. Make sure you discuss any questions you have with your health care provider. Document Revised: 12/15/2019 Document Reviewed: 12/15/2019 Elsevier Patient Education  2021 [employer]vier Inc.  

## 2021-01-18 LAB — THYROID PANEL WITH TSH
Free Thyroxine Index: 2.1 (ref 1.2–4.9)
T3 Uptake Ratio: 26 % (ref 24–39)
T4, Total: 8.2 ug/dL (ref 4.5–12.0)
TSH: 1.72 u[IU]/mL (ref 0.450–4.500)

## 2021-01-18 LAB — CBC WITH DIFFERENTIAL/PLATELET
Basophils Absolute: 0.1 10*3/uL (ref 0.0–0.2)
Basos: 1 %
EOS (ABSOLUTE): 0.2 10*3/uL (ref 0.0–0.4)
Eos: 4 %
Hematocrit: 52.1 % — ABNORMAL HIGH (ref 37.5–51.0)
Hemoglobin: 17.7 g/dL (ref 13.0–17.7)
Immature Grans (Abs): 0 10*3/uL (ref 0.0–0.1)
Immature Granulocytes: 0 %
Lymphocytes Absolute: 2.5 10*3/uL (ref 0.7–3.1)
Lymphs: 42 %
MCH: 28.3 pg (ref 26.6–33.0)
MCHC: 34 g/dL (ref 31.5–35.7)
MCV: 83 fL (ref 79–97)
Monocytes Absolute: 0.6 10*3/uL (ref 0.1–0.9)
Monocytes: 11 %
Neutrophils Absolute: 2.5 10*3/uL (ref 1.4–7.0)
Neutrophils: 42 %
Platelets: 309 10*3/uL (ref 150–450)
RBC: 6.26 x10E6/uL — ABNORMAL HIGH (ref 4.14–5.80)
RDW: 13 % (ref 11.6–15.4)
WBC: 5.9 10*3/uL (ref 3.4–10.8)

## 2021-01-18 LAB — CMP14+EGFR
ALT: 23 IU/L (ref 0–44)
AST: 18 IU/L (ref 0–40)
Albumin/Globulin Ratio: 1.7 (ref 1.2–2.2)
Albumin: 4.5 g/dL (ref 4.0–5.0)
Alkaline Phosphatase: 60 IU/L (ref 44–121)
BUN/Creatinine Ratio: 7 — ABNORMAL LOW (ref 9–20)
BUN: 7 mg/dL (ref 6–20)
Bilirubin Total: 0.4 mg/dL (ref 0.0–1.2)
CO2: 23 mmol/L (ref 20–29)
Calcium: 9.4 mg/dL (ref 8.7–10.2)
Chloride: 103 mmol/L (ref 96–106)
Creatinine, Ser: 0.95 mg/dL (ref 0.76–1.27)
Globulin, Total: 2.6 g/dL (ref 1.5–4.5)
Glucose: 94 mg/dL (ref 65–99)
Potassium: 3.8 mmol/L (ref 3.5–5.2)
Sodium: 140 mmol/L (ref 134–144)
Total Protein: 7.1 g/dL (ref 6.0–8.5)
eGFR: 109 mL/min/{1.73_m2} (ref >59)

## 2021-01-18 LAB — LIPID PANEL
Chol/HDL Ratio: 5.6 ratio — ABNORMAL HIGH (ref 0.0–5.0)
Cholesterol, Total: 201 mg/dL — ABNORMAL HIGH (ref 100–199)
HDL: 36 mg/dL — ABNORMAL LOW (ref >39)
LDL Chol Calc (NIH): 148 mg/dL — ABNORMAL HIGH (ref 0–99)
Triglycerides: 93 mg/dL (ref 0–149)
VLDL Cholesterol Cal: 17 mg/dL (ref 5–40)

## 2021-04-26 DIAGNOSIS — Z6833 Body mass index (BMI) 33.0-33.9, adult: Secondary | ICD-10-CM | POA: Diagnosis not present

## 2021-04-26 DIAGNOSIS — J329 Chronic sinusitis, unspecified: Secondary | ICD-10-CM | POA: Diagnosis not present

## 2021-04-26 DIAGNOSIS — J029 Acute pharyngitis, unspecified: Secondary | ICD-10-CM | POA: Diagnosis not present

## 2021-07-22 ENCOUNTER — Ambulatory Visit: Payer: BLUE CROSS/BLUE SHIELD | Admitting: Nurse Practitioner

## 2021-07-22 ENCOUNTER — Ambulatory Visit: Payer: Self-pay | Admitting: Nurse Practitioner

## 2021-07-22 ENCOUNTER — Encounter: Payer: Self-pay | Admitting: Nurse Practitioner

## 2021-07-22 VITALS — BP 131/77 | HR 82 | Temp 98.0°F | Resp 20 | Ht 70.0 in | Wt 226.0 lb

## 2021-07-22 DIAGNOSIS — E0590 Thyrotoxicosis, unspecified without thyrotoxic crisis or storm: Secondary | ICD-10-CM

## 2021-07-22 DIAGNOSIS — Z6833 Body mass index (BMI) 33.0-33.9, adult: Secondary | ICD-10-CM | POA: Diagnosis not present

## 2021-07-22 DIAGNOSIS — F419 Anxiety disorder, unspecified: Secondary | ICD-10-CM | POA: Diagnosis not present

## 2021-07-22 DIAGNOSIS — E876 Hypokalemia: Secondary | ICD-10-CM

## 2021-07-22 MED ORDER — ALPRAZOLAM 0.5 MG PO TABS: 0.5000 mg | ORAL_TABLET | Freq: Two times a day (BID) | ORAL | 5 refills | Status: DC | PRN

## 2021-07-22 MED ORDER — METHIMAZOLE 5 MG PO TABS: ORAL_TABLET | ORAL | 5 refills | Status: DC

## 2021-07-22 MED ORDER — POTASSIUM CHLORIDE CRYS ER 20 MEQ PO TBCR: 20.0000 meq | EXTENDED_RELEASE_TABLET | Freq: Every day | ORAL | 5 refills | Status: DC

## 2021-07-22 NOTE — Progress Notes (Addendum)
Subjective:    Patient ID: Christian Fowler, male    DOB: 1986-01-06, 35 y.o.   MRN: [id number]  Chief Complaint: Medical Management of Chronic Issues    HPI:  1. Hyperthyroidism No problems that he is aware / Lab Results  Component Value Date   TSH 1.720 01/17/2021     2. Hypokalemia No c/o lower ext cramping. Lab Results  Component Value Date   K 3.8 01/17/2021     3. Anxiety disorder, unspecified type He is on xanax BID. Usually only has to take when he is at work. GAD 7 : Generalized Anxiety Score 07/22/2021 01/17/2021 01/17/2020 12/01/2018  Nervous, Anxious, on Edge _0 Control/stop worrying _1 Worry too much - different things _2 Trouble relaxing _3 Restless 1 1 0 2  Easily annoyed or irritable 1 1 0 1  Afraid - awful might happen 1 2 0 2  Total GAD 7 Score _4 Anxiety Difficulty Somewhat difficult Very difficult - -      4. BMI 33.0-33.9,adult No recent weight changes Wt Readings from Last 3 Encounters:  07/22/21 226 lb (102.5 kg)  01/17/21 230 lb (104.3 kg)  07/18/20 230 lb (104.3 kg)   BMI Readings from Last 3 Encounters:  07/22/21 32.43 kg/m  01/17/21 33.00 kg/m  07/18/20 33.00 kg/m        Outpatient Encounter Medications as of 07/22/2021  Medication Sig   ALPRAZolam (XANAX) 0.5 MG tablet Take 1 tablet (0.5 mg total) by mouth 2 (two) times daily as needed. for anxiety   Ginkgo Biloba 40 MG TABS Take by mouth.   methimazole (TAPAZOLE) 5 MG tablet TAKE ONE TABLET (5 MG) BY MOUTH EVERY OTHER DAY ALTERNATING  WITH  2  TABLETS  EVERY  OTHER  DAY   MILK THISTLE PO Take by mouth.   Multiple Vitamin (MULTIVITAMIN) tablet Take 1 tablet by mouth daily.   Omega-3 Fatty Acids (FISH OIL PO) Take by mouth.   potassium chloride SA (KLOR-CON M20) 20 MEQ tablet Take 1 tablet (20 mEq total) by mouth daily.   No facility-administered encounter medications on file as of 07/22/2021.    History reviewed. No pertinent surgical  history.  History reviewed. No pertinent family history.  New complaints: None today  Social history: Lives with is wife.   Controlled substance contract: 01/21/21     Review of Systems  Constitutional:  Negative for diaphoresis.  Eyes:  Negative for pain.  Respiratory:  Negative for shortness of breath.   Cardiovascular:  Negative for chest pain, palpitations and leg swelling.  Gastrointestinal:  Negative for abdominal pain.  Endocrine: Negative for polydipsia.  Skin:  Negative for rash.  Neurological:  Negative for dizziness, weakness and headaches.  Hematological:  Does not bruise/bleed easily.  All other systems reviewed and are negative.     Objective:   Physical Exam Vitals and nursing note reviewed.  Constitutional:      Appearance: Normal appearance. He is well-developed.  HENT:     Head: Normocephalic.     Nose: Nose normal.  Eyes:     Pupils: Pupils are equal, round, and reactive to light.  Neck:     Thyroid: No thyroid mass or thyromegaly.     Vascular: No carotid bruit or JVD.     Trachea: Phonation normal.  Cardiovascular:     Rate and Rhythm: Normal rate and regular rhythm.  Comments: Vaircose veins in bil lower ext. Pulmonary:     Effort: Pulmonary effort is normal. No respiratory distress.     Breath sounds: Normal breath sounds.  Abdominal:     General: Bowel sounds are normal.     Palpations: Abdomen is soft.     Tenderness: There is no abdominal tenderness.  Musculoskeletal:        General: Normal range of motion.     Cervical back: Normal range of motion and neck supple.  Lymphadenopathy:     Cervical: No cervical adenopathy.  Skin:    General: Skin is warm and dry.  Neurological:     Mental Status: He is alert and oriented to person, place, and time.  Psychiatric:        Behavior: Behavior normal.        Thought Content: Thought content normal.        Judgment: Judgment normal.   BP 131/77   Pulse 82   Temp 98 F (36.7 C)  (Temporal)   Resp 20   Ht 5' 10" (1.778 m)   Wt 226 lb (102.5 kg)   SpO2 96%   BMI 32.43 kg/m         Assessment & Plan:   Christian Fowler comes in today with chief complaint of Medical Management of Chronic Issues   Diagnosis and orders addressed:  1. Hyperthyroidism Labs pending - CBC with Differential/Platelet - CMP14+EGFR - Lipid panel - Thyroid Panel With TSH - methimazole (TAPAZOLE) 5 MG tablet; TAKE ONE TABLET (5 MG) BY MOUTH EVERY OTHER DAY ALTERNATING  WITH  2  TABLETS  EVERY  OTHER  DAY  Dispense: 45 tablet; Refill: 5  2. Hypokalemia Labs pending - potassium chloride SA (KLOR-CON M20) 20 MEQ tablet; Take 1 tablet (20 mEq total) by mouth daily.  Dispense: 30 tablet; Refill: 5  3. Anxiety disorder, unspecified type Stress management Would like for him to try ciltalopram but he refuses at this time - ALPRAZolam (XANAX) 0.5 MG tablet; Take 1 tablet (0.5 mg total) by mouth 2 (two) times daily as needed. for anxiety  Dispense: 30 tablet; Refill: 5  4. BMI 33.0-33.9,adult Discussed diet and exercise for person with BMI >25 Will recheck weight in 3-6 months  Compression socks for varicose veins  Labs pending Health Maintenance reviewed Diet and exercise encouraged  Follow up plan: 6 months   [place], FNP

## 2021-07-22 NOTE — Patient Instructions (Signed)

## 2021-07-23 LAB — CMP14+EGFR
ALT: 46 IU/L — ABNORMAL HIGH (ref 0–44)
AST: 26 IU/L (ref 0–40)
Albumin/Globulin Ratio: 1.8 (ref 1.2–2.2)
Albumin: 4.6 g/dL (ref 4.0–5.0)
Alkaline Phosphatase: 64 IU/L (ref 44–121)
BUN/Creatinine Ratio: 12 (ref 9–20)
BUN: 9 mg/dL (ref 6–20)
Bilirubin Total: 0.5 mg/dL (ref 0.0–1.2)
CO2: 23 mmol/L (ref 20–29)
Calcium: 9.4 mg/dL (ref 8.7–10.2)
Chloride: 103 mmol/L (ref 96–106)
Creatinine, Ser: 0.78 mg/dL (ref 0.76–1.27)
Globulin, Total: 2.6 g/dL (ref 1.5–4.5)
Glucose: 89 mg/dL (ref 70–99)
Potassium: 4.2 mmol/L (ref 3.5–5.2)
Sodium: 139 mmol/L (ref 134–144)
Total Protein: 7.2 g/dL (ref 6.0–8.5)
eGFR: 121 mL/min/{1.73_m2} (ref >59)

## 2021-07-23 LAB — CBC WITH DIFFERENTIAL/PLATELET
Basophils Absolute: 0 10*3/uL (ref 0.0–0.2)
Basos: 1 %
EOS (ABSOLUTE): 0.1 10*3/uL (ref 0.0–0.4)
Eos: 2 %
Hematocrit: 51.9 % — ABNORMAL HIGH (ref 37.5–51.0)
Hemoglobin: 17.3 g/dL (ref 13.0–17.7)
Immature Grans (Abs): 0 10*3/uL (ref 0.0–0.1)
Immature Granulocytes: 0 %
Lymphocytes Absolute: 1.9 10*3/uL (ref 0.7–3.1)
Lymphs: 35 %
MCH: 27.9 pg (ref 26.6–33.0)
MCHC: 33.3 g/dL (ref 31.5–35.7)
MCV: 84 fL (ref 79–97)
Monocytes Absolute: 0.4 10*3/uL (ref 0.1–0.9)
Monocytes: 8 %
Neutrophils Absolute: 2.9 10*3/uL (ref 1.4–7.0)
Neutrophils: 54 %
Platelets: 324 10*3/uL (ref 150–450)
RBC: 6.21 x10E6/uL — ABNORMAL HIGH (ref 4.14–5.80)
RDW: 14.2 % (ref 11.6–15.4)
WBC: 5.5 10*3/uL (ref 3.4–10.8)

## 2021-07-23 LAB — LIPID PANEL
Chol/HDL Ratio: 4.8 ratio (ref 0.0–5.0)
Cholesterol, Total: 184 mg/dL (ref 100–199)
HDL: 38 mg/dL — ABNORMAL LOW (ref >39)
LDL Chol Calc (NIH): 134 mg/dL — ABNORMAL HIGH (ref 0–99)
Triglycerides: 61 mg/dL (ref 0–149)
VLDL Cholesterol Cal: 12 mg/dL (ref 5–40)

## 2021-07-23 LAB — THYROID PANEL WITH TSH
Free Thyroxine Index: 1.6 (ref 1.2–4.9)
T3 Uptake Ratio: 23 % — ABNORMAL LOW (ref 24–39)
T4, Total: 6.9 ug/dL (ref 4.5–12.0)
TSH: 1.46 u[IU]/mL (ref 0.450–4.500)

## 2022-01-20 ENCOUNTER — Encounter: Payer: Self-pay | Admitting: Nurse Practitioner

## 2022-01-20 ENCOUNTER — Ambulatory Visit: Payer: BLUE CROSS/BLUE SHIELD | Admitting: Nurse Practitioner

## 2022-01-20 ENCOUNTER — Other Ambulatory Visit: Payer: BLUE CROSS/BLUE SHIELD

## 2022-01-20 ENCOUNTER — Other Ambulatory Visit: Payer: Self-pay

## 2022-01-20 VITALS — BP 131/80 | HR 91 | Temp 98.2°F | Resp 20 | Ht 70.0 in | Wt 245.0 lb

## 2022-01-20 DIAGNOSIS — E0590 Thyrotoxicosis, unspecified without thyrotoxic crisis or storm: Secondary | ICD-10-CM | POA: Diagnosis not present

## 2022-01-20 DIAGNOSIS — F419 Anxiety disorder, unspecified: Secondary | ICD-10-CM

## 2022-01-20 DIAGNOSIS — Z6833 Body mass index (BMI) 33.0-33.9, adult: Secondary | ICD-10-CM

## 2022-01-20 DIAGNOSIS — I8393 Asymptomatic varicose veins of bilateral lower extremities: Secondary | ICD-10-CM

## 2022-01-20 DIAGNOSIS — E876 Hypokalemia: Secondary | ICD-10-CM | POA: Diagnosis not present

## 2022-01-20 DIAGNOSIS — R6889 Other general symptoms and signs: Secondary | ICD-10-CM | POA: Diagnosis not present

## 2022-01-20 DIAGNOSIS — Z79891 Long term (current) use of opiate analgesic: Secondary | ICD-10-CM | POA: Diagnosis not present

## 2022-01-20 MED ORDER — METHIMAZOLE 5 MG PO TABS: ORAL_TABLET | ORAL | 1 refills | Status: DC

## 2022-01-20 MED ORDER — POTASSIUM CHLORIDE CRYS ER 20 MEQ PO TBCR: 20.0000 meq | EXTENDED_RELEASE_TABLET | Freq: Every day | ORAL | 5 refills | Status: DC

## 2022-01-20 MED ORDER — ALPRAZOLAM 0.5 MG PO TABS: 0.5000 mg | ORAL_TABLET | Freq: Two times a day (BID) | ORAL | 5 refills | Status: DC | PRN

## 2022-01-20 NOTE — Patient Instructions (Signed)

## 2022-01-20 NOTE — Progress Notes (Signed)
Subjective:    Patient ID: Christian Fowler, male    DOB: [birth year], 36 y.o.   MRN: [id number]   Chief Complaint: Medical Management of Chronic Issues (Wants [place] of gallbladder)    HPI:  Jaimen Rothman is a 36 y.o. who identifies as a male who was assigned male at birth.   Social history: Lives with: HIS SON  LIVES WITH HIM Work history: [employer]   Comes in today for follow up of the following chronic medical issues:  1. Hyperthyroidism No problems that he is aware of.  Lab Results  Component Value Date   TSH 1.460 07/22/2021     2. Hypokalemia No c/o muscle cramps. Lab Results  Component Value Date   K 4.2 07/22/2021     3. Anxiety disorder, unspecified type Is on xanax BID.    01/20/2022    3:06 PM 07/22/2021    3:18 PM 01/17/2021    2:53 PM 01/17/2020    2:17 PM  GAD 7 : Generalized Anxiety Score  Nervous, Anxious, on Edge _0 Control/stop worrying _1 Worry too much - different things _2 Trouble relaxing _3 Restless _4 0  Easily annoyed or irritable _5 0  Afraid - awful might happen _6 0  Total GAD 7 Score _7 Anxiety Difficulty Somewhat difficult Somewhat difficult Very difficult       4. Asymptomatic varicose veins of both lower extremities Not bothering him.  5. BMI 33.0-33.9,adult Weight is up19lbs Wt Readings from Last 3 Encounters:  01/20/22 245 lb (111.1 kg)  07/22/21 226 lb (102.5 kg)  01/17/21 230 lb (104.3 kg)   BMI Readings from Last 3 Encounters:  01/20/22 35.15 kg/m  07/22/21 32.43 kg/m  01/17/21 33.00 kg/m        New complaints: None today  Allergies  Allergen Reactions   Celexa [Citalopram]     Suicidal thoughts    Outpatient Encounter Medications as of 01/20/2022  Medication Sig   ALPRAZolam (XANAX) 0.5 MG tablet Take 1 tablet (0.5 mg total) by mouth 2 (two) times daily as needed. for anxiety   Ginkgo Biloba 40 MG TABS Take by mouth.   methimazole (TAPAZOLE) 5 MG tablet TAKE ONE  TABLET (5 MG) BY MOUTH EVERY OTHER DAY ALTERNATING  WITH  2  TABLETS  EVERY  OTHER  DAY   MILK THISTLE PO Take by mouth.   Multiple Vitamin (MULTIVITAMIN) tablet Take 1 tablet by mouth daily.   Omega-3 Fatty Acids (FISH OIL PO) Take by mouth.   potassium chloride SA (KLOR-CON M20) 20 MEQ tablet Take 1 tablet (20 mEq total) by mouth daily.   No facility-administered encounter medications on file as of 01/20/2022.    History reviewed. No pertinent surgical history.  History reviewed. No pertinent family history.    Controlled substance contract: 01/20/22- drug screen done today                                  Review of Systems  Constitutional:  Negative for diaphoresis.  Eyes:  Negative for pain.  Respiratory:  Negative for shortness of breath.   Cardiovascular:  Negative for chest pain, palpitations and leg swelling.  Gastrointestinal:  Negative for abdominal pain.  Endocrine: Negative for polydipsia.  Skin:  Negative for rash.  Neurological:  Negative  for dizziness, weakness and headaches.  Hematological:  Does not bruise/bleed easily.  All other systems reviewed and are negative.     Objective:   Physical Exam Vitals and nursing note reviewed.  Constitutional:      Appearance: Normal appearance. He is well-developed.  HENT:     Head: Normocephalic.     Nose: Nose normal.     Mouth/Throat:     Mouth: Mucous membranes are moist.     Pharynx: Oropharynx is clear.  Eyes:     Pupils: Pupils are equal, round, and reactive to light.  Neck:     Thyroid: No thyroid mass or thyromegaly.     Vascular: No carotid bruit or JVD.     Trachea: Phonation normal.  Cardiovascular:     Rate and Rhythm: Normal rate and regular rhythm.  Pulmonary:     Effort: Pulmonary effort is normal. No respiratory distress.     Breath sounds: Normal breath sounds.  Abdominal:     General: Bowel sounds are normal.     Palpations: Abdomen is soft.     Tenderness: There is no abdominal tenderness.   Musculoskeletal:        General: Normal range of motion.     Cervical back: Normal range of motion and neck supple.  Lymphadenopathy:     Cervical: No cervical adenopathy.  Skin:    General: Skin is warm and dry.  Neurological:     Mental Status: He is alert and oriented to person, place, and time.  Psychiatric:        Behavior: Behavior normal.        Thought Content: Thought content normal.        Judgment: Judgment normal.    BP 131/80   Pulse 91   Temp 98.2 F (36.8 C) (Temporal)   Resp 20   Ht _0  (1.778 m)   Wt 245 lb (111.1 kg)   SpO2 95%   BMI 35.15 kg/m       Assessment & Plan:  Hammad Finkler comes in today with chief complaint of Medical Management of Chronic Issues (Wants [place] of gallbladder)   Diagnosis and orders addressed:  1. Hyperthyroidism labspending - Thyroid Panel With TSH - methimazole (TAPAZOLE) 5 MG tablet; TAKE ONE TABLET (5 MG) BY MOUTH EVERY OTHER DAY ALTERNATING  WITH  2  TABLETS  EVERY  OTHER  DAY  Dispense: 135 tablet; Refill: 1 - CBC with Differential/Platelet - Lipid panel - Thyroid Panel With TSH  2. Hypokalemia Labs pending - Lipid panel - CMP14+EGFR - CBC with Differential/Platelet - potassium chloride SA (KLOR-CON M20) 20 MEQ tablet; Take 1 tablet (20 mEq total) by mouth daily.  Dispense: 30 tablet; Refill: 5 - CMP14+EGFR  3. Anxiety disorder, unspecified type Stress management - ALPRAZolam (XANAX) 0.5 MG tablet; Take 1 tablet (0.5 mg total) by mouth 2 (two) times daily as needed. for anxiety  Dispense: 30 tablet; Refill: 5  4. Asymptomatic varicose veins of both lower extremities Continue wearing compression hose  5. BMI 33.0-33.9,adult Discussed diet and exercise for person with BMI >25 Will recheck weight in 3-6 months    Labs pending Health Maintenance reviewed Diet and exercise encouraged  Follow up plan: 6 months   Mary-Margaret Hassell Done, FNP

## 2022-01-20 NOTE — Addendum Note (Signed)
Addended by: Rolena Infante on: 01/20/2022 03:30 PM   Modules accepted: Orders

## 2022-01-21 LAB — LIPID PANEL
Chol/HDL Ratio: 4.6 ratio (ref 0.0–5.0)
Cholesterol, Total: 193 mg/dL (ref 100–199)
HDL: 42 mg/dL (ref >39)
LDL Chol Calc (NIH): 136 mg/dL — ABNORMAL HIGH (ref 0–99)
Triglycerides: 80 mg/dL (ref 0–149)
VLDL Cholesterol Cal: 15 mg/dL (ref 5–40)

## 2022-01-21 LAB — CBC WITH DIFFERENTIAL/PLATELET
Basophils Absolute: 0.1 10*3/uL (ref 0.0–0.2)
Basos: 1 %
EOS (ABSOLUTE): 0.3 10*3/uL (ref 0.0–0.4)
Eos: 5 %
Hematocrit: 48.2 % (ref 37.5–51.0)
Hemoglobin: 17.2 g/dL (ref 13.0–17.7)
Immature Grans (Abs): 0 10*3/uL (ref 0.0–0.1)
Immature Granulocytes: 0 %
Lymphocytes Absolute: 2.2 10*3/uL (ref 0.7–3.1)
Lymphs: 36 %
MCH: 29.6 pg (ref 26.6–33.0)
MCHC: 35.7 g/dL (ref 31.5–35.7)
MCV: 83 fL (ref 79–97)
Monocytes Absolute: 0.6 10*3/uL (ref 0.1–0.9)
Monocytes: 10 %
Neutrophils Absolute: 2.9 10*3/uL (ref 1.4–7.0)
Neutrophils: 48 %
Platelets: 294 10*3/uL (ref 150–450)
RBC: 5.81 x10E6/uL — ABNORMAL HIGH (ref 4.14–5.80)
RDW: 12.9 % (ref 11.6–15.4)
WBC: 6 10*3/uL (ref 3.4–10.8)

## 2022-01-21 LAB — CMP14+EGFR
ALT: 41 IU/L (ref 0–44)
AST: 29 IU/L (ref 0–40)
Albumin/Globulin Ratio: 1.8 (ref 1.2–2.2)
Albumin: 4.6 g/dL (ref 4.0–5.0)
Alkaline Phosphatase: 61 IU/L (ref 44–121)
BUN/Creatinine Ratio: 9 (ref 9–20)
BUN: 8 mg/dL (ref 6–20)
Bilirubin Total: 0.4 mg/dL (ref 0.0–1.2)
CO2: 24 mmol/L (ref 20–29)
Calcium: 9.2 mg/dL (ref 8.7–10.2)
Chloride: 105 mmol/L (ref 96–106)
Creatinine, Ser: 0.94 mg/dL (ref 0.76–1.27)
Globulin, Total: 2.5 g/dL (ref 1.5–4.5)
Glucose: 89 mg/dL (ref 70–99)
Potassium: 4 mmol/L (ref 3.5–5.2)
Sodium: 141 mmol/L (ref 134–144)
Total Protein: 7.1 g/dL (ref 6.0–8.5)
eGFR: 110 mL/min/{1.73_m2} (ref >59)

## 2022-01-21 LAB — THYROID PANEL WITH TSH
Free Thyroxine Index: 2.3 (ref 1.2–4.9)
T3 Uptake Ratio: 28 % (ref 24–39)
T4, Total: 8.2 ug/dL (ref 4.5–12.0)
TSH: 1.65 u[IU]/mL (ref 0.450–4.500)

## 2022-01-23 LAB — TOXASSURE SELECT 13 (MW), URINE

## 2022-02-12 DIAGNOSIS — F419 Anxiety disorder, unspecified: Secondary | ICD-10-CM | POA: Diagnosis not present

## 2022-02-12 DIAGNOSIS — E039 Hypothyroidism, unspecified: Secondary | ICD-10-CM | POA: Diagnosis not present

## 2022-02-12 DIAGNOSIS — K7689 Other specified diseases of liver: Secondary | ICD-10-CM | POA: Diagnosis not present

## 2022-02-12 DIAGNOSIS — Z79899 Other long term (current) drug therapy: Secondary | ICD-10-CM | POA: Diagnosis not present

## 2022-02-12 DIAGNOSIS — R1011 Right upper quadrant pain: Secondary | ICD-10-CM | POA: Diagnosis not present

## 2022-02-12 DIAGNOSIS — E785 Hyperlipidemia, unspecified: Secondary | ICD-10-CM | POA: Diagnosis not present

## 2022-02-12 DIAGNOSIS — R1110 Vomiting, unspecified: Secondary | ICD-10-CM | POA: Diagnosis not present

## 2022-02-12 DIAGNOSIS — K219 Gastro-esophageal reflux disease without esophagitis: Secondary | ICD-10-CM | POA: Diagnosis not present

## 2022-03-11 DIAGNOSIS — Z6833 Body mass index (BMI) 33.0-33.9, adult: Secondary | ICD-10-CM | POA: Diagnosis not present

## 2022-03-11 DIAGNOSIS — K645 Perianal venous thrombosis: Secondary | ICD-10-CM | POA: Diagnosis not present

## 2022-03-20 ENCOUNTER — Other Ambulatory Visit: Payer: Self-pay | Admitting: Nurse Practitioner

## 2022-03-20 DIAGNOSIS — E0590 Thyrotoxicosis, unspecified without thyrotoxic crisis or storm: Secondary | ICD-10-CM

## 2022-03-27 DIAGNOSIS — R109 Unspecified abdominal pain: Secondary | ICD-10-CM | POA: Diagnosis not present

## 2022-03-27 DIAGNOSIS — R194 Change in bowel habit: Secondary | ICD-10-CM | POA: Diagnosis not present

## 2022-05-02 DIAGNOSIS — R194 Change in bowel habit: Secondary | ICD-10-CM | POA: Diagnosis not present

## 2022-05-02 DIAGNOSIS — K219 Gastro-esophageal reflux disease without esophagitis: Secondary | ICD-10-CM | POA: Diagnosis not present

## 2022-05-02 DIAGNOSIS — R1011 Right upper quadrant pain: Secondary | ICD-10-CM | POA: Diagnosis not present

## 2022-07-22 ENCOUNTER — Ambulatory Visit: Payer: BLUE CROSS/BLUE SHIELD | Admitting: Nurse Practitioner

## 2022-07-24 ENCOUNTER — Encounter: Payer: Self-pay | Admitting: Nurse Practitioner

## 2022-07-24 ENCOUNTER — Other Ambulatory Visit: Payer: Self-pay

## 2022-07-24 ENCOUNTER — Ambulatory Visit: Payer: Commercial Managed Care - PPO | Admitting: Nurse Practitioner

## 2022-07-24 VITALS — BP 136/79 | HR 65 | Temp 97.5°F | Resp 20 | Ht 70.0 in | Wt 246.0 lb

## 2022-07-24 DIAGNOSIS — Z6833 Body mass index (BMI) 33.0-33.9, adult: Secondary | ICD-10-CM

## 2022-07-24 DIAGNOSIS — F419 Anxiety disorder, unspecified: Secondary | ICD-10-CM | POA: Diagnosis not present

## 2022-07-24 DIAGNOSIS — I8393 Asymptomatic varicose veins of bilateral lower extremities: Secondary | ICD-10-CM

## 2022-07-24 DIAGNOSIS — E0590 Thyrotoxicosis, unspecified without thyrotoxic crisis or storm: Secondary | ICD-10-CM

## 2022-07-24 DIAGNOSIS — E876 Hypokalemia: Secondary | ICD-10-CM

## 2022-07-24 DIAGNOSIS — J0100 Acute maxillary sinusitis, unspecified: Secondary | ICD-10-CM

## 2022-07-24 DIAGNOSIS — H9313 Tinnitus, bilateral: Secondary | ICD-10-CM

## 2022-07-24 MED ORDER — METHIMAZOLE 5 MG PO TABS
ORAL_TABLET | ORAL | 1 refills | Status: DC
Start: 2022-07-24 — End: 2023-01-23

## 2022-07-24 MED ORDER — AMOXICILLIN-POT CLAVULANATE 875-125 MG PO TABS: 1.0000 | ORAL_TABLET | Freq: Two times a day (BID) | ORAL | 0 refills | Status: DC

## 2022-07-24 MED ORDER — POTASSIUM CHLORIDE CRYS ER 20 MEQ PO TBCR: 20.0000 meq | EXTENDED_RELEASE_TABLET | Freq: Every day | ORAL | 1 refills | Status: DC

## 2022-07-24 MED ORDER — ALPRAZOLAM 0.5 MG PO TABS: 0.5000 mg | ORAL_TABLET | Freq: Two times a day (BID) | ORAL | 5 refills | Status: DC | PRN

## 2022-07-24 NOTE — Progress Notes (Signed)
Subjective:    Patient ID: Christian Fowler, male    DOB: 10/14/1985, 36 y.o.   MRN: [id number]   Chief Complaint: Medical Management of Chronic Issues and Sinus Problem    HPI:  Christian Fowler is a 36 y.o. who identifies as a male who was assigned male at birth.   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 Hyperthyroidism No problem tha the is aware of. Lab Results  Component Value Date   TSH 1.650 01/20/2022    2. Hypokalemia No c/o muscle cramps Lab Results  Component Value Date   K 4.0 01/20/2022     3. Asymptomatic varicose veins of both lower extremities Does not bnother him.  4. Anxiety disorder, unspecified type Is on xanax and is doing well.    07/24/2022    4:06 PM 01/20/2022    3:06 PM 07/22/2021    3:18 PM 01/17/2021    2:53 PM  GAD 7 : Generalized Anxiety Score  Nervous, Anxious, on Edge _0 Control/stop worrying _1 Worry too much - different things _2 Trouble relaxing _3 Restless _4 Easily annoyed or irritable _5 Afraid - awful might happen _6 Total GAD 7 Score _7 Anxiety Difficulty Somewhat difficult Somewhat difficult Somewhat difficult Very difficult      5. BMI 33.0-33.9,adult No recent weight changes  Wt Readings from Last 3 Encounters:  07/24/22 246 lb (111.6 kg)  01/20/22 245 lb (111.1 kg)  07/22/21 226 lb (102.5 kg)   BMI Readings from Last 3 Encounters:  07/24/22 35.30 kg/m  01/20/22 35.15 kg/m  07/22/21 32.43 kg/m    New complaints: Sinus pressure in face for greater then 8 days. No fever. Slight cough. Has taken no OTC mefs.  Allergies  Allergen Reactions   Celexa [Citalopram]     Suicidal thoughts    Outpatient Encounter Medications as of 07/24/2022  Medication Sig   ALPRAZolam (XANAX) 0.5 MG tablet Take 1 tablet (0.5 mg total) by mouth 2 (two)  times daily as needed. for anxiety   Ginkgo Biloba 40 MG TABS Take by mouth.   methimazole (TAPAZOLE) 5 MG tablet TAKE ONE TABLET (5 MG) BY MOUTH EVERY OTHER DAY ALTERNATING  WITH  2  TABLETS  EVERY  OTHER  DAY   MILK THISTLE PO Take by mouth.   Multiple Vitamin (MULTIVITAMIN) tablet Take 1 tablet by mouth daily.   Omega-3 Fatty Acids (FISH OIL PO) Take by mouth.   potassium chloride SA (KLOR-CON M20) 20 MEQ tablet Take 1 tablet (20 mEq total) by mouth daily.   No facility-administered encounter medications on file as of 07/24/2022.    History reviewed. No pertinent surgical history.  History reviewed. No pertinent family history.    Controlled substance contract: n/a     Review of Systems  Constitutional:  Negative for chills, diaphoresis and fever.  HENT:  Positive for congestion, rhinorrhea, sinus pressure and sinus pain.   Eyes:  Negative for pain.  Respiratory:  Positive for cough. Negative for shortness of breath.   Cardiovascular:  Negative for chest pain, palpitations and leg swelling.  Gastrointestinal:  Negative for abdominal pain.  Endocrine: Negative for polydipsia.  Skin:  Negative for rash.  Neurological:  Negative for dizziness, weakness and headaches.  Hematological:  Does not bruise/bleed easily.  All other systems reviewed and are negative.      Objective:   Physical Exam Vitals and nursing note reviewed.  Constitutional:      Appearance: Normal appearance. He is well-developed.  HENT:     Head: Normocephalic.     Nose: Congestion and rhinorrhea present.     Right Sinus: Maxillary sinus tenderness present.     Left Sinus: Maxillary sinus tenderness present.     Mouth/Throat:     Mouth: Mucous membranes are moist.     Pharynx: Oropharynx is clear.  Eyes:     Pupils: Pupils are equal, round, and reactive to light.  Neck:     Thyroid: No thyroid mass or thyromegaly.     Vascular: No carotid bruit or JVD.     Trachea: Phonation normal.   Cardiovascular:     Rate and Rhythm: Normal rate and regular rhythm.  Pulmonary:     Effort: Pulmonary effort is normal. No respiratory distress.     Breath sounds: Normal breath sounds.  Abdominal:     General: Bowel sounds are normal.     Palpations: Abdomen is soft.     Tenderness: There is no abdominal tenderness.  Musculoskeletal:        General: Normal range of motion.     Cervical back: Normal range of motion and neck supple.  Lymphadenopathy:     Cervical: No cervical adenopathy.  Skin:    General: Skin is warm and dry.  Neurological:     Mental Status: He is alert and oriented to person, place, and time.  Psychiatric:        Behavior: Behavior normal.        Thought Content: Thought content normal.        Judgment: Judgment normal.    BP 136/79   Pulse 65   Temp (!) 97.5 F (36.4 C) (Temporal)   Resp 20   Ht _0  (1.778 m)   Wt 246 lb (111.6 kg)   SpO2 99%   BMI 35.30 kg/m         Assessment & Plan:  [name] comes in today with chief complaint of Medical Management of Chronic Issues and Sinus Problem   Diagnosis and orders addressed:  1. Hyperthyroidism Labs pending - Thyroid Panel With TSH - CBC with Differential/Platelet - CMP14+EGFR - Lipid panel - methimazole (TAPAZOLE) 5 MG tablet; TAKE ONE TABLET (5 MG) BY MOUTH EVERY OTHER DAY ALTERNATING  WITH  2  TABLETS  EVERY  OTHER  DAY  Dispense: 135 tablet; Refill: 1  2. Hypokalemia Labs pending - potassium chloride SA (KLOR-CON M20) 20 MEQ tablet; Take 1 tablet (20 mEq total) by mouth daily.  Dispense: 90 tablet; Refill: 1  3. Asymptomatic varicose veins of both lower extremities Wear compression hose when up on feet alot  4. Anxiety disorder, unspecified type Stress manaegemnt - ALPRAZolam (XANAX) 0.5 MG tablet; Take 1 tablet (0.5 mg total) by mouth 2 (two) times daily as needed. for anxiety  Dispense: 30 tablet; Refill: 5  5. BMI 33.0-33.9,adult Discussed diet and exercise for  person with BMI >25 Will recheck weight in 3-6 months   6. Acute non-recurrent maxillary sinusitis 1. Take meds as prescribed 2. Use a cool mist humidifier especially during the winter months and when heat has been humid. 3. Use saline nose sprays frequently 4. Saline irrigations of the nose can be  very helpful if done frequently.  * 4X daily for 1 week*  * Use of a nettie pot can be helpful with this. Follow directions with this* 5. Drink plenty of fluids 6. Keep thermostat turn down low 7.For any cough or congestion- mucinex or delsym if nneded 8. For fever or aces or pains- take tylenol or ibuprofen appropriate for age and weight.  * for fevers greater than 101 orally you may alternate ibuprofen and tylenol every  3 hours.    - amoxicillin-clavulanate (AUGMENTIN) 875-125 MG tablet; Take 1 tablet by mouth 2 (two) times daily.  Dispense: 14 tablet; Refill: 0   Labs pending Health Maintenance reviewed Diet and exercise encouraged  Follow up plan: 6 months   Mary-Margaret Hassell Done, FNP

## 2022-07-25 LAB — THYROID PANEL WITH TSH
Free Thyroxine Index: 2.4 (ref 1.2–4.9)
T3 Uptake Ratio: 28 % (ref 24–39)
T4, Total: 8.6 ug/dL (ref 4.5–12.0)
TSH: 1.55 u[IU]/mL (ref 0.450–4.500)

## 2022-07-25 LAB — CMP14+EGFR
ALT: 27 IU/L (ref 0–44)
AST: 19 IU/L (ref 0–40)
Albumin/Globulin Ratio: 2.1 (ref 1.2–2.2)
Albumin: 4.7 g/dL (ref 4.1–5.1)
Alkaline Phosphatase: 59 IU/L (ref 44–121)
BUN/Creatinine Ratio: 10 (ref 9–20)
BUN: 9 mg/dL (ref 6–20)
Bilirubin Total: 0.7 mg/dL (ref 0.0–1.2)
CO2: 22 mmol/L (ref 20–29)
Calcium: 9.4 mg/dL (ref 8.7–10.2)
Chloride: 105 mmol/L (ref 96–106)
Creatinine, Ser: 0.9 mg/dL (ref 0.76–1.27)
Globulin, Total: 2.2 g/dL (ref 1.5–4.5)
Glucose: 85 mg/dL (ref 70–99)
Potassium: 4 mmol/L (ref 3.5–5.2)
Sodium: 141 mmol/L (ref 134–144)
Total Protein: 6.9 g/dL (ref 6.0–8.5)
eGFR: 115 mL/min/{1.73_m2} (ref >59)

## 2022-07-25 LAB — CBC WITH DIFFERENTIAL/PLATELET
Basophils Absolute: 0 10*3/uL (ref 0.0–0.2)
Basos: 1 %
EOS (ABSOLUTE): 0.2 10*3/uL (ref 0.0–0.4)
Eos: 4 %
Hematocrit: 49 % (ref 37.5–51.0)
Hemoglobin: 16.9 g/dL (ref 13.0–17.7)
Immature Grans (Abs): 0 10*3/uL (ref 0.0–0.1)
Immature Granulocytes: 0 %
Lymphocytes Absolute: 2.8 10*3/uL (ref 0.7–3.1)
Lymphs: 42 %
MCH: 28.7 pg (ref 26.6–33.0)
MCHC: 34.5 g/dL (ref 31.5–35.7)
MCV: 83 fL (ref 79–97)
Monocytes Absolute: 0.6 10*3/uL (ref 0.1–0.9)
Monocytes: 9 %
Neutrophils Absolute: 2.9 10*3/uL (ref 1.4–7.0)
Neutrophils: 44 %
Platelets: 298 10*3/uL (ref 150–450)
RBC: 5.88 x10E6/uL — ABNORMAL HIGH (ref 4.14–5.80)
RDW: 12.7 % (ref 11.6–15.4)
WBC: 6.6 10*3/uL (ref 3.4–10.8)

## 2022-07-25 LAB — LIPID PANEL
Chol/HDL Ratio: 4.7 ratio (ref 0.0–5.0)
Cholesterol, Total: 177 mg/dL (ref 100–199)
HDL: 38 mg/dL — ABNORMAL LOW (ref >39)
LDL Chol Calc (NIH): 126 mg/dL — ABNORMAL HIGH (ref 0–99)
Triglycerides: 69 mg/dL (ref 0–149)
VLDL Cholesterol Cal: 13 mg/dL (ref 5–40)

## 2023-01-23 ENCOUNTER — Ambulatory Visit: Payer: Commercial Managed Care - PPO | Admitting: Nurse Practitioner

## 2023-01-23 ENCOUNTER — Encounter: Payer: Self-pay | Admitting: Nurse Practitioner

## 2023-01-23 VITALS — BP 129/76 | HR 81 | Temp 98.2°F | Resp 20 | Ht 70.0 in | Wt 240.0 lb

## 2023-01-23 DIAGNOSIS — F419 Anxiety disorder, unspecified: Secondary | ICD-10-CM

## 2023-01-23 DIAGNOSIS — I8393 Asymptomatic varicose veins of bilateral lower extremities: Secondary | ICD-10-CM

## 2023-01-23 DIAGNOSIS — L309 Dermatitis, unspecified: Secondary | ICD-10-CM | POA: Diagnosis not present

## 2023-01-23 DIAGNOSIS — E876 Hypokalemia: Secondary | ICD-10-CM

## 2023-01-23 DIAGNOSIS — E0590 Thyrotoxicosis, unspecified without thyrotoxic crisis or storm: Secondary | ICD-10-CM | POA: Diagnosis not present

## 2023-01-23 DIAGNOSIS — Z6833 Body mass index (BMI) 33.0-33.9, adult: Secondary | ICD-10-CM

## 2023-01-23 MED ORDER — ALPRAZOLAM 0.5 MG PO TABS: 0.5000 mg | ORAL_TABLET | Freq: Two times a day (BID) | ORAL | 5 refills | Status: DC | PRN

## 2023-01-23 MED ORDER — AZITHROMYCIN 250 MG PO TABS: ORAL_TABLET | ORAL | 0 refills | Status: DC

## 2023-01-23 MED ORDER — POTASSIUM CHLORIDE CRYS ER 20 MEQ PO TBCR: 20.0000 meq | EXTENDED_RELEASE_TABLET | Freq: Every day | ORAL | 1 refills | Status: DC

## 2023-01-23 MED ORDER — METHIMAZOLE 5 MG PO TABS: ORAL_TABLET | ORAL | 1 refills | Status: DC

## 2023-01-23 NOTE — Progress Notes (Signed)
Subjective:    Patient ID: Christian Fowler, male    DOB: [birth year], 37 y.o.   MRN: [id number]   Chief Complaint: Medical Management of Chronic Issues    HPI:  Azeem Eatherly is a 37 y.o. who identifies as a male who was assigned male at birth.   Social history: Lives with: his son lives with him Work history: Corporate treasurer   Comes in today for follow up of the following chronic medical issues:  1. Hyperthyroidism No problems that aware of. Lab Results  Component Value Date   TSH 1.550 07/24/2022     2. Hypokalemia Denies any muscle cramps Lab Results  Component Value Date   K 4.0 07/24/2022     3. Asymptomatic varicose veins of both lower extremities They do not bother him  4. Eczema, unspecified type Currently not bothering him  5. Anxiety disorder, unspecified type Takes xanax as needed    01/23/2023    9:31 AM 07/24/2022    4:06 PM 01/20/2022    3:06 PM 07/22/2021    3:18 PM  GAD 7 : Generalized Anxiety Score  Nervous, Anxious, on Edge 3 3 3 3   Control/stop worrying 3 3 3 2   Worry too much - different things 3 3 3 2   Trouble relaxing 2 3 2 1   Restless 1 1 1 1   Easily annoyed or irritable 1 2 1 1   Afraid - awful might happen 2 2 1 1   Total GAD 7 Score 15 17 14 11   Anxiety Difficulty Somewhat difficult Somewhat difficult Somewhat difficult Somewhat difficult      6. BMI 33.0-33.9,adult No recent weight changes Wt Readings from Last 3 Encounters:  01/23/23 240 lb (108.9 kg)  07/24/22 246 lb (111.6 kg)  01/20/22 245 lb (111.1 kg)   BMI Readings from Last 3 Encounters:  01/23/23 34.44 kg/m  07/24/22 35.30 kg/m  01/20/22 35.15 kg/m      New complaints: None today  Allergies  Allergen Reactions   Celexa [Citalopram]     Suicidal thoughts    Outpatient Encounter Medications as of 01/23/2023  Medication Sig   ALPRAZolam (XANAX) 0.5 MG tablet Take 1 tablet (0.5 mg total) by mouth 2 (two) times daily as needed. for anxiety   Ginkgo  Biloba 40 MG TABS Take by mouth.   methimazole (TAPAZOLE) 5 MG tablet TAKE ONE TABLET (5 MG) BY MOUTH EVERY OTHER DAY ALTERNATING  WITH  2  TABLETS  EVERY  OTHER  DAY   MILK THISTLE PO Take by mouth.   Multiple Vitamin (MULTIVITAMIN) tablet Take 1 tablet by mouth daily.   Omega-3 Fatty Acids (FISH OIL PO) Take by mouth.   potassium chloride SA (KLOR-CON M20) 20 MEQ tablet Take 1 tablet (20 mEq total) by mouth daily.   [DISCONTINUED] amoxicillin-clavulanate (AUGMENTIN) 875-125 MG tablet Take 1 tablet by mouth 2 (two) times daily.   No facility-administered encounter medications on file as of 01/23/2023.    History reviewed. No pertinent surgical history.  History reviewed. No pertinent family history.    Controlled substance contract: n/a     Review of Systems  Constitutional:  Negative for diaphoresis.  Eyes:  Negative for pain.  Respiratory:  Negative for shortness of breath.   Cardiovascular:  Negative for chest pain, palpitations and leg swelling.  Gastrointestinal:  Negative for abdominal pain.  Endocrine: Negative for polydipsia.  Skin:  Negative for rash.  Neurological:  Negative for dizziness, weakness and headaches.  Hematological:  Does not  bruise/bleed easily.  All other systems reviewed and are negative.      Objective:   Physical Exam Vitals and nursing note reviewed.  Constitutional:      Appearance: Normal appearance. He is well-developed.  HENT:     Head: Normocephalic.     Nose: Nose normal.     Mouth/Throat:     Mouth: Mucous membranes are moist.     Pharynx: Oropharynx is clear.  Eyes:     Pupils: Pupils are equal, round, and reactive to light.  Neck:     Thyroid: No thyroid mass or thyromegaly.     Vascular: No carotid bruit or JVD.     Trachea: Phonation normal.  Cardiovascular:     Rate and Rhythm: Normal rate and regular rhythm.  Pulmonary:     Effort: Pulmonary effort is normal. No respiratory distress.     Breath sounds: Normal breath  sounds.  Abdominal:     General: Bowel sounds are normal.     Palpations: Abdomen is soft.     Tenderness: There is no abdominal tenderness.  Musculoskeletal:        General: Normal range of motion.     Cervical back: Normal range of motion and neck supple.  Lymphadenopathy:     Cervical: No cervical adenopathy.  Skin:    General: Skin is warm and dry.  Neurological:     Mental Status: He is alert and oriented to person, place, and time.  Psychiatric:        Behavior: Behavior normal.        Thought Content: Thought content normal.        Judgment: Judgment normal.    BP 129/76   Pulse 81   Temp 98.2 F (36.8 C) (Temporal)   Resp 20   Ht 5\' 10"  (1.778 m)   Wt 240 lb (108.9 kg)   SpO2 94%   BMI 34.44 kg/m         Assessment & Plan:   Deven Nimtz comes in today with chief complaint of Medical Management of Chronic Issues   Diagnosis and orders addressed:  1. Hyperthyroidism Labs pending - methimazole (TAPAZOLE) 5 MG tablet; TAKE ONE TABLET (5 MG) BY MOUTH EVERY OTHER DAY ALTERNATING  WITH  2  TABLETS  EVERY  OTHER  DAY  Dispense: 135 tablet; Refill: 1 - Thyroid Panel With TSH  2. Hypokalemia Labs pending - potassium chloride SA (KLOR-CON M20) 20 MEQ tablet; Take 1 tablet (20 mEq total) by mouth daily.  Dispense: 90 tablet; Refill: 1 - CBC with Differential/Platelet - CMP14+EGFR  3. Asymptomatic varicose veins of both lower extremities Elevate legs when sitting  4. Eczema, unspecified type  5. Anxiety disorder, unspecified type Stress management - ToxASSURE Select 13 (MW), Urine - ALPRAZolam (XANAX) 0.5 MG tablet; Take 1 tablet (0.5 mg total) by mouth 2 (two) times daily as needed. for anxiety  Dispense: 30 tablet; Refill: 5  6. BMI 33.0-33.9,adult Discussed diet and exercise for person with BMI >25 Will recheck weight in 3-6 months    Labs pending Health Maintenance reviewed Diet and exercise encouraged  Follow up plan: 6  months   Mary-Margaret Daphine Deutscher, FNP

## 2023-01-23 NOTE — Patient Instructions (Signed)
Eczema Eczema refers to a group of skin conditions that cause skin to become rough and inflamed. Each type of eczema has different triggers, symptoms, and treatments. Eczema of any type is usually itchy. Symptoms range from mild to severe. Eczema is not spread from person to person (is not contagious). It can appear on different parts of the body at different times. One person's eczema may look different from another person's eczema. What are the causes? The exact cause of this condition is not known. However, exposure to certain environmental factors, irritants, and allergens can make the condition worse. What are the signs or symptoms? Symptoms of this condition depend on the type of eczema you have. The types include: Contact dermatitis. There are two kinds: Irritant contact dermatitis. This happens when something irritates the skin and causes a rash. Allergic contact dermatitis. This happens when your skin comes in contact with something you are allergic to (allergens). This can include poison ivy, chemicals, or medicines that were applied to your skin. Atopic dermatitis. This is a long-term (chronic) skin disease that keeps coming back (recurring). It is the most common type of eczema. Usual symptoms are a red rash and itchy, dry, scaly skin. It usually starts showing signs in infancy and can last through adulthood. Dyshidrotic eczema. This is a form of eczema on the hands and feet. It shows up as very itchy, fluid-filled blisters. It can affect people of any age but is more common before age 40. Hand eczema. This causes very itchy areas of skin on the palms and sides of the hands and fingers. This type of eczema is common in industrial jobs where you may be exposed to different types of irritants. Lichen simplex chronicus. This type of eczema occurs when a person constantly scratches one area of the body. Repeated scratching of the area leads to thickened skin (lichenification). This condition can  accompany other types of eczema. It is more common in adults but may also be seen in children. Nummular eczema. This is a common type of eczema that most often affects the lower legs and the backs of the hands. It typically causes an itchy, red, circular, crusty lesion (plaque). Scratching may become a habit and can cause bleeding. Nummular eczema occurs most often in middle-aged or older people. Seborrheic dermatitis. This is a common skin disease that mainly affects the scalp. It may also affect other oily areas of the body, such as the face, sides of the nose, eyebrows, ears, eyelids, and chest. It is marked by small scaling and redness of the skin (erythema). This can affect people of all ages. In infants, this condition is called cradle cap. Stasis dermatitis. This is a common skin disease that can cause itching, scaling, and hyperpigmentation, usually on the legs and feet. It occurs most often in people who have a condition that prevents blood from being pumped through the veins in the legs (chronic venous insufficiency). Stasis dermatitis is a chronic condition that needs long-term management. How is this diagnosed? This condition may be diagnosed based on: A physical exam of your skin. Your medical history. Skin patch tests. These tests involve using patches that contain possible allergens and placing them on your back. Your health care provider will check in a few days to see if an allergic reaction occurred. How is this treated? Treatment for eczema is based on the type of eczema you have. You may be given hydrocortisone steroid medicine or antihistamines. These can relieve itching quickly and help reduce inflammation.   These may be prescribed or purchased over the counter, depending on the strength that is needed. Follow these instructions at home: Take or apply over-the-counter and prescription medicines only as told by your health care provider. Use creams or ointments to moisturize your  skin. Do not use lotions. Learn what triggers or irritates your symptoms so you can avoid these things. Treat symptom flare-ups quickly. Do not scratch your skin. This can make your rash worse. Keep all follow-up visits. This is important. Where to find more information American Academy of Dermatology: aad.org National Eczema Association: nationaleczema.org The Society for Pediatric Dermatology: pedsderm.net Contact a health care provider if: You have severe itching, even with treatment. You scratch your skin regularly until it bleeds. Your rash looks different than usual. Your skin is painful, swollen, or more red than usual. You have a fever. Summary Eczema refers to a group of skin conditions that cause skin to become rough and inflamed. Each type has different triggers. Eczema of any type causes itching that may range from mild to severe. Treatment varies based on the type of eczema you have. Hydrocortisone steroid medicine or antihistamines can help with itching and inflammation. Protecting your skin is the best way to prevent eczema. Use creams or ointments to moisturize your skin. Avoid triggers and irritants. Treat flare-ups quickly. This information is not intended to replace advice given to you by your health care provider. Make sure you discuss any questions you have with your health care provider. Document Revised: 05/11/2020 Document Reviewed: 05/14/2020 Elsevier Patient Education  2024 Elsevier Inc.  

## 2023-01-24 LAB — CBC WITH DIFFERENTIAL/PLATELET
Basophils Absolute: 0 10*3/uL (ref 0.0–0.2)
Basos: 1 %
EOS (ABSOLUTE): 0.2 10*3/uL (ref 0.0–0.4)
Eos: 3 %
Hematocrit: 46 % (ref 37.5–51.0)
Hemoglobin: 15.4 g/dL (ref 13.0–17.7)
Immature Grans (Abs): 0 10*3/uL (ref 0.0–0.1)
Immature Granulocytes: 0 %
Lymphocytes Absolute: 2.1 10*3/uL (ref 0.7–3.1)
Lymphs: 31 %
MCH: 28.4 pg (ref 26.6–33.0)
MCHC: 33.5 g/dL (ref 31.5–35.7)
MCV: 85 fL (ref 79–97)
Monocytes Absolute: 0.6 10*3/uL (ref 0.1–0.9)
Monocytes: 8 %
Neutrophils Absolute: 4 10*3/uL (ref 1.4–7.0)
Neutrophils: 57 %
Platelets: 270 10*3/uL (ref 150–450)
RBC: 5.43 x10E6/uL (ref 4.14–5.80)
RDW: 13.7 % (ref 11.6–15.4)
WBC: 7 10*3/uL (ref 3.4–10.8)

## 2023-01-24 LAB — CMP14+EGFR
ALT: 23 IU/L (ref 0–44)
AST: 18 IU/L (ref 0–40)
Albumin/Globulin Ratio: 2 (ref 1.2–2.2)
Albumin: 4.6 g/dL (ref 4.1–5.1)
Alkaline Phosphatase: 58 IU/L (ref 44–121)
BUN/Creatinine Ratio: 9 (ref 9–20)
BUN: 7 mg/dL (ref 6–20)
Bilirubin Total: 0.4 mg/dL (ref 0.0–1.2)
CO2: 25 mmol/L (ref 20–29)
Calcium: 9.3 mg/dL (ref 8.7–10.2)
Chloride: 103 mmol/L (ref 96–106)
Creatinine, Ser: 0.81 mg/dL (ref 0.76–1.27)
Globulin, Total: 2.3 g/dL (ref 1.5–4.5)
Glucose: 84 mg/dL (ref 70–99)
Potassium: 3.6 mmol/L (ref 3.5–5.2)
Sodium: 140 mmol/L (ref 134–144)
Total Protein: 6.9 g/dL (ref 6.0–8.5)
eGFR: 119 mL/min/{1.73_m2} (ref >59)

## 2023-01-24 LAB — THYROID PANEL WITH TSH
Free Thyroxine Index: 2.2 (ref 1.2–4.9)
T3 Uptake Ratio: 28 % (ref 24–39)
T4, Total: 7.8 ug/dL (ref 4.5–12.0)
TSH: 3.35 u[IU]/mL (ref 0.450–4.500)

## 2023-01-27 ENCOUNTER — Ambulatory Visit: Payer: Commercial Managed Care - PPO | Admitting: Nurse Practitioner

## 2023-01-28 LAB — TOXASSURE SELECT 13 (MW), URINE

## 2023-06-04 ENCOUNTER — Telehealth: Payer: Self-pay | Admitting: Nurse Practitioner

## 2023-06-04 NOTE — Telephone Encounter (Signed)
Pt called stating that he has Hyperthyroidism and wants to know if he is allowed to take Ashwaganda Supplement?   Can covering provider advise on this.

## 2023-06-06 NOTE — Telephone Encounter (Signed)
I do not know what those supplements are

## 2023-06-08 NOTE — Telephone Encounter (Signed)
Patient notified and verbalized understanding. 

## 2023-06-18 ENCOUNTER — Ambulatory Visit: Payer: Commercial Managed Care - PPO | Admitting: Nurse Practitioner

## 2023-06-18 ENCOUNTER — Encounter: Payer: Self-pay | Admitting: Nurse Practitioner

## 2023-06-18 VITALS — BP 127/68 | HR 90 | Temp 98.1°F | Resp 20 | Ht 70.0 in | Wt 244.2 lb

## 2023-06-18 DIAGNOSIS — F419 Anxiety disorder, unspecified: Secondary | ICD-10-CM

## 2023-06-18 MED ORDER — ESCITALOPRAM OXALATE 10 MG PO TABS
10.0000 mg | ORAL_TABLET | Freq: Every day | ORAL | 1 refills | Status: DC
Start: 2023-06-18 — End: 2023-06-30

## 2023-06-18 NOTE — Progress Notes (Signed)
Subjective:    Patient ID: [name], male    DOB: [birth year], 37 y.o.   MRN: [id number]   Chief Complaint: anxiety  HPI  Patient is on xanax daily. He says that it is causing brain fog and he would like to come off of it. He has been taking 1 1/2 -2 a day. He has cut back and has been doing 1 a day.     06/18/2023    2:26 PM 01/23/2023    9:31 AM 07/24/2022    4:06 PM 01/20/2022    3:06 PM  GAD 7 : Generalized Anxiety Score  Nervous, Anxious, on Edge 2 3 3 3   Control/stop worrying 2 3 3 3   Worry too much - different things 2 3 3 3   Trouble relaxing 3 2 3 2   Restless 2 1 1 1   Easily annoyed or irritable 3 1 2 1   Afraid - awful might happen 2 2 2 1   Total GAD 7 Score 16 15 17 14   Anxiety Difficulty Somewhat difficult Somewhat difficult Somewhat difficult Somewhat difficult     Patient Active Problem List   Diagnosis Date Noted   BMI 33.0-33.9,adult 01/17/2020   Asymptomatic varicose veins of both lower extremities 06/01/2018   Hyperthyroidism 12/01/2017   Eczema 12/01/2017   Hypokalemia 12/01/2017   Anxiety disorder 10/28/2017       Review of Systems  Constitutional:  Negative for diaphoresis.  Eyes:  Negative for pain.  Respiratory:  Negative for shortness of breath.   Cardiovascular:  Negative for chest pain, palpitations and leg swelling.  Gastrointestinal:  Negative for abdominal pain.  Endocrine: Negative for polydipsia.  Skin:  Negative for rash.  Neurological:  Negative for dizziness, weakness and headaches.  Hematological:  Does not bruise/bleed easily.  All other systems reviewed and are negative.      Objective:   Physical Exam Vitals and nursing note reviewed.  Constitutional:      Appearance: Normal appearance. He is well-developed.  Neck:     Thyroid: No thyroid mass or thyromegaly.     Vascular: No carotid bruit or JVD.     Trachea: Phonation normal.  Cardiovascular:     Rate and Rhythm: Normal rate and regular rhythm.  Pulmonary:      Effort: Pulmonary effort is normal. No respiratory distress.     Breath sounds: Normal breath sounds.  Abdominal:     General: Bowel sounds are normal.     Palpations: Abdomen is soft.     Tenderness: There is no abdominal tenderness.  Musculoskeletal:        General: Normal range of motion.     Cervical back: Normal range of motion and neck supple.  Lymphadenopathy:     Cervical: No cervical adenopathy.  Skin:    General: Skin is warm and dry.  Neurological:     Mental Status: He is alert and oriented to person, place, and time.  Psychiatric:        Behavior: Behavior normal.        Thought Content: Thought content normal.        Judgment: Judgment normal.    BP 127/68   Pulse 90   Temp 98.1 F (36.7 C) (Temporal)   Resp 20   Ht 5\' 10"  (1.778 m)   Wt 244 lb 3.2 oz (110.8 kg)   SpO2 95%   BMI 35.04 kg/m         Assessment & Plan:  [name] in today with  chief complaint of No chief complaint on file.   1. Anxiety Stress management 'wean off of xanax- 1/2 tablet daily for 1 week then EQD for a week then stop - escitalopram (LEXAPRO) 10 MG tablet; Take 1 tablet (10 mg total) by mouth daily.  Dispense: 90 tablet; Refill: 1    The above assessment and management plan was discussed with the patient. The patient verbalized understanding of and has agreed to the management plan. Patient is aware to call the clinic if symptoms persist or worsen. Patient is aware when to return to the clinic for a follow-up visit. Patient educated on when it is appropriate to go to the emergency department.   Mary-Margaret Daphine Deutscher, FNP

## 2023-06-18 NOTE — Patient Instructions (Signed)

## 2023-06-22 ENCOUNTER — Telehealth: Payer: Self-pay | Admitting: Nurse Practitioner

## 2023-06-22 NOTE — Telephone Encounter (Signed)
Left detailed message on patient's voicemail as requested. 

## 2023-06-22 NOTE — Telephone Encounter (Signed)
Those side effects you are experiencing will go away  in a few more days- has to get in system good first. Need to give it at least 3 weeks before stopping it.

## 2023-06-22 NOTE — Telephone Encounter (Signed)
Please review and advise.

## 2023-06-22 NOTE — Telephone Encounter (Signed)
Patient calls back and states he can't take medication for three more weeks and not get any sleep. Wants to know if there something else he can take because he is not taking the medication anymore since Saturday afternoon.

## 2023-06-23 ENCOUNTER — Other Ambulatory Visit: Payer: Self-pay | Admitting: Nurse Practitioner

## 2023-06-23 MED ORDER — SERTRALINE HCL 50 MG PO TABS: 50.0000 mg | ORAL_TABLET | Freq: Every day | ORAL | 2 refills | Status: DC

## 2023-06-23 NOTE — Telephone Encounter (Signed)
We can try zoloft- I will send in prescription

## 2023-06-23 NOTE — Telephone Encounter (Signed)
Patient notified and verbalized understanding. 

## 2023-06-30 ENCOUNTER — Telehealth: Payer: Self-pay

## 2023-06-30 DIAGNOSIS — F419 Anxiety disorder, unspecified: Secondary | ICD-10-CM

## 2023-06-30 MED ORDER — ALPRAZOLAM 0.5 MG PO TABS
0.5000 mg | ORAL_TABLET | Freq: Two times a day (BID) | ORAL | 5 refills | Status: DC | PRN
Start: 2023-06-30 — End: 2024-01-18

## 2023-06-30 NOTE — Addendum Note (Signed)
Addended by: Bennie Pierini on: 06/30/2023 01:13 PM   Modules accepted: Orders

## 2023-06-30 NOTE — Telephone Encounter (Signed)
Copied from CRM [id number]. Topic: Clinical - Medical Advice >> Jun 30, 2023  8:58 AM Prudencio Pair wrote: Reason for CRM: Patient, Christian Fowler, called in stating he wants to see if Dr. Gennette Pac can place him back on Alprazolam. He's currently on Zoloft. Stated he was in intimacy over the weekend and had orgasm & it made him feel weird. Stated it made his heart feel weird as well. States he's never experienced that before. Patient states it seems as if Zoloft is not helping and it's not helping with his anxiety either. Christian Fowler also stated that he does not want to take anymore of the Lexapro at all. CB # is [phone].

## 2023-07-20 ENCOUNTER — Ambulatory Visit: Payer: Commercial Managed Care - PPO | Admitting: Nurse Practitioner

## 2023-07-20 ENCOUNTER — Encounter: Payer: Self-pay | Admitting: Nurse Practitioner

## 2023-07-20 VITALS — BP 117/72 | HR 70 | Temp 97.3°F | Resp 20 | Ht 70.0 in | Wt 241.0 lb

## 2023-07-20 DIAGNOSIS — E0590 Thyrotoxicosis, unspecified without thyrotoxic crisis or storm: Secondary | ICD-10-CM

## 2023-07-20 DIAGNOSIS — E876 Hypokalemia: Secondary | ICD-10-CM | POA: Diagnosis not present

## 2023-07-20 DIAGNOSIS — F419 Anxiety disorder, unspecified: Secondary | ICD-10-CM | POA: Diagnosis not present

## 2023-07-20 DIAGNOSIS — I8393 Asymptomatic varicose veins of bilateral lower extremities: Secondary | ICD-10-CM | POA: Diagnosis not present

## 2023-07-20 DIAGNOSIS — Z6833 Body mass index (BMI) 33.0-33.9, adult: Secondary | ICD-10-CM

## 2023-07-20 DIAGNOSIS — J0100 Acute maxillary sinusitis, unspecified: Secondary | ICD-10-CM

## 2023-07-20 MED ORDER — POTASSIUM CHLORIDE CRYS ER 20 MEQ PO TBCR
20.0000 meq | EXTENDED_RELEASE_TABLET | Freq: Every day | ORAL | 1 refills | Status: DC
Start: 2023-07-20 — End: 2024-01-18

## 2023-07-20 MED ORDER — AZITHROMYCIN 250 MG PO TABS: ORAL_TABLET | ORAL | 0 refills | Status: DC

## 2023-07-20 MED ORDER — METHIMAZOLE 5 MG PO TABS: ORAL_TABLET | ORAL | 1 refills | Status: DC

## 2023-07-20 NOTE — Progress Notes (Addendum)
Subjective:    Patient ID: Christian Fowler, male    DOB: 1986/03/21, 37 y.o.   MRN: [id number]   Chief Complaint: Medical Management of Chronic Issues (Sinus problem/)    HPI:  Christian Fowler is a 37 y.o. who identifies as a male who was assigned male at birth.   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 Hyperthyroidism No issue that aware of. Denies palpitations. Is on tapazole. Lab Results  Component Value Date   TSH 3.350 01/23/2023     2. Hypokalemia No c/o muscle cramps Lab Results  Component Value Date   K 3.6 01/23/2023     3. Asymptomatic varicose veins of both lower extremities Does not bother him  4. Anxiety disorder, unspecified type Is on xanax bid prn    07/20/2023   11:11 AM 06/18/2023    2:26 PM 01/23/2023    9:31 AM 07/24/2022    4:06 PM  GAD 7 : Generalized Anxiety Score  Nervous, Anxious, on Edge 1 2 3 3   Control/stop worrying 2 2 3 3   Worry too much - different things 1 2 3 3   Trouble relaxing 3 3 2 3   Restless 2 2 1 1   Easily annoyed or irritable 2 3 1 2   Afraid - awful might happen 1 2 2 2   Total GAD 7 Score 12 16 15 17   Anxiety Difficulty Somewhat difficult Somewhat difficult Somewhat difficult Somewhat difficult      5. BMI 33.0-33.9,adult Weight is down 3lbs Wt Readings from Last 3 Encounters:  07/20/23 241 lb (109.3 kg)  06/18/23 244 lb 3.2 oz (110.8 kg)  01/23/23 240 lb (108.9 kg)   BP Readings from Last 3 Encounters:  07/20/23 117/72  06/18/23 127/68  01/23/23 129/76      New complaints: Sinus pressure that start 2 days ago. Nose is burning. No fever  Allergies  Allergen Reactions   Celexa [Citalopram]     Suicidal thoughts    Outpatient Encounter Medications as of 07/20/2023  Medication Sig   ALPRAZolam (XANAX) 0.5 MG tablet Take 1 tablet (0.5 mg total) by mouth 2 (two) times daily as needed. for anxiety    Ginkgo Biloba 40 MG TABS Take by mouth.   methimazole (TAPAZOLE) 5 MG tablet TAKE ONE TABLET (5 MG) BY MOUTH EVERY OTHER DAY ALTERNATING  WITH  2  TABLETS  EVERY  OTHER  DAY   Multiple Vitamin (MULTIVITAMIN) tablet Take 1 tablet by mouth daily.   Omega-3 Fatty Acids (FISH OIL PO) Take by mouth.   potassium chloride SA (KLOR-CON M20) 20 MEQ tablet Take 1 tablet (20 mEq total) by mouth daily.   [DISCONTINUED] MILK THISTLE PO Take by mouth.   [DISCONTINUED] azithromycin (ZITHROMAX Z-PAK) 250 MG tablet As directed (Patient not taking: Reported on 06/18/2023)   No facility-administered encounter medications on file as of 07/20/2023.    History reviewed. No pertinent surgical history.  History reviewed. No pertinent family history.    Controlled substance contract: n/a     Review of Systems  Constitutional:  Negative for diaphoresis.  Eyes:  Negative for pain.  Respiratory:  Negative for shortness of breath.   Cardiovascular:  Negative for chest pain, palpitations and leg swelling.  Gastrointestinal:  Negative for abdominal pain.  Endocrine: Negative for polydipsia.  Skin:  Negative for rash.  Neurological:  Negative for dizziness, weakness and headaches.  Hematological:  Does not bruise/bleed easily.  All other systems reviewed and are negative.      Objective:   Physical Exam Constitutional:      Appearance: He is well-developed.  HENT:     Head: Normocephalic.     Right Ear: External ear normal.     Left Ear: External ear normal.     Nose:     Right Sinus: Maxillary sinus tenderness present.     Left Sinus: Maxillary sinus tenderness present.     Mouth/Throat:     Mouth: Oropharynx is clear and moist.  Eyes:     Extraocular Movements: EOM normal.     Pupils: Pupils are equal, round, and reactive to light.  Neck:     Thyroid: No thyromegaly.     Vascular: No JVD.  Cardiovascular:     Rate and Rhythm: Normal rate and regular rhythm.     Heart sounds: Normal heart  sounds. No murmur heard.    No friction rub. No gallop.  Pulmonary:     Effort: Pulmonary effort is normal. No respiratory distress.     Breath sounds: Normal breath sounds. No wheezing or rales.  Chest:     Chest wall: No tenderness.  Abdominal:     General: Bowel sounds are normal.     Palpations: Abdomen is soft. There is no mass.     Tenderness: There is no abdominal tenderness.  Genitourinary:    Penis: Normal.      Prostate: Normal.  Musculoskeletal:        General: Normal range of motion.     Cervical back: Normal range of motion and neck supple.     Right lower leg: Edema (1+) present.     Left lower leg: Edema (1+) present.  Lymphadenopathy:     Cervical: No cervical adenopathy.  Skin:    General: Skin is warm and dry.  Neurological:     Mental Status: He is alert and oriented to person, place, and time.     Cranial Nerves: No cranial nerve deficit.  Psychiatric:        Mood and Affect: Mood and affect normal.        Behavior: Behavior normal.        Thought Content: Thought content normal.        Judgment: Judgment normal.     BP 117/72   Pulse 70   Temp (!) 97.3 F (36.3 C) (Temporal)   Resp 20   Ht 5\' 10"  (1.778 m)   Wt 241 lb (109.3 kg)   SpO2 95%   BMI 34.58 kg/m        Assessment & Plan:   Christian Fowler comes in today with chief complaint of Medical Management of Chronic Issues (Sinus problem/)   Diagnosis and orders addressed:  1. Hyperthyroidism Labs pending - methimazole (TAPAZOLE) 5 MG tablet; TAKE ONE TABLET (5 MG) BY MOUTH EVERY OTHER DAY ALTERNATING  WITH  2  TABLETS  EVERY  OTHER  DAY  Dispense: 135 tablet; Refill: 1 - Thyroid Panel With TSH  2. Hypokalemia Lab pending - potassium chloride SA (KLOR-CON M20) 20 MEQ tablet; Take 1 tablet (20 mEq total) by mouth daily.  Dispense: 90 tablet; Refill: 1 - CBC with Differential/Platelet - CMP14+EGFR - Lipid panel  3. Asymptomatic varicose veins of both lower extremities Continue to  wear compression socks  4. Anxiety disorder, unspecified type Stress management  5. BMI 33.0-33.9,adult Discussed diet and exercise for person with BMI >25 Will  recheck weight in 3-6 months  6. Sinusitis 1. Take meds as prescribed 2. Use a cool mist humidifier especially during the winter months and when heat has been humid. 3. Use saline nose sprays frequently 4. Saline irrigations of the nose can be very helpful if done frequently.  * 4X daily for 1 week*  * Use of a nettie pot can be helpful with this. Follow directions with this* 5. Drink plenty of fluids 6. Keep thermostat turn down low 7.For any cough or congestion- OTC meds 8. For fever or aces or pains- take tylenol or ibuprofen appropriate for age and weight.  * for fevers greater than 101 orally you may alternate ibuprofen and tylenol every  3 hours.   Zpak as directed  Labs pending Health Maintenance reviewed Diet and exercise encouraged  Follow up plan: 6 months   Mary-Margaret Daphine Deutscher, FNP

## 2023-07-20 NOTE — Addendum Note (Signed)
Addended by: Bennie Pierini on: 07/20/2023 11:32 AM   Modules accepted: Orders

## 2023-07-21 LAB — CMP14+EGFR
ALT: 26 [IU]/L (ref 0–44)
AST: 21 [IU]/L (ref 0–40)
Albumin: 4.6 g/dL (ref 4.1–5.1)
Alkaline Phosphatase: 62 [IU]/L (ref 44–121)
BUN/Creatinine Ratio: 9 (ref 9–20)
BUN: 8 mg/dL (ref 6–20)
Bilirubin Total: 0.6 mg/dL (ref 0.0–1.2)
CO2: 23 mmol/L (ref 20–29)
Calcium: 9.1 mg/dL (ref 8.7–10.2)
Chloride: 102 mmol/L (ref 96–106)
Creatinine, Ser: 0.92 mg/dL (ref 0.76–1.27)
Globulin, Total: 2.4 g/dL (ref 1.5–4.5)
Glucose: 86 mg/dL (ref 70–99)
Potassium: 4.2 mmol/L (ref 3.5–5.2)
Sodium: 142 mmol/L (ref 134–144)
Total Protein: 7 g/dL (ref 6.0–8.5)
eGFR: 111 mL/min/{1.73_m2} (ref >59)

## 2023-07-21 LAB — CBC WITH DIFFERENTIAL/PLATELET
Basophils Absolute: 0 10*3/uL (ref 0.0–0.2)
Basos: 1 %
EOS (ABSOLUTE): 0.2 10*3/uL (ref 0.0–0.4)
Eos: 4 %
Hematocrit: 53.3 % — ABNORMAL HIGH (ref 37.5–51.0)
Hemoglobin: 17.2 g/dL (ref 13.0–17.7)
Immature Grans (Abs): 0 10*3/uL (ref 0.0–0.1)
Immature Granulocytes: 0 %
Lymphocytes Absolute: 1.9 10*3/uL (ref 0.7–3.1)
Lymphs: 37 %
MCH: 28 pg (ref 26.6–33.0)
MCHC: 32.3 g/dL (ref 31.5–35.7)
MCV: 87 fL (ref 79–97)
Monocytes Absolute: 0.6 10*3/uL (ref 0.1–0.9)
Monocytes: 11 %
Neutrophils Absolute: 2.4 10*3/uL (ref 1.4–7.0)
Neutrophils: 47 %
Platelets: 302 10*3/uL (ref 150–450)
RBC: 6.15 x10E6/uL — ABNORMAL HIGH (ref 4.14–5.80)
RDW: 13.1 % (ref 11.6–15.4)
WBC: 5.2 10*3/uL (ref 3.4–10.8)

## 2023-07-21 LAB — THYROID PANEL WITH TSH
Free Thyroxine Index: 2 (ref 1.2–4.9)
T3 Uptake Ratio: 25 % (ref 24–39)
T4, Total: 7.8 ug/dL (ref 4.5–12.0)
TSH: 1.95 u[IU]/mL (ref 0.450–4.500)

## 2023-07-21 LAB — LIPID PANEL
Chol/HDL Ratio: 5.5 {ratio} — ABNORMAL HIGH (ref 0.0–5.0)
Cholesterol, Total: 205 mg/dL — ABNORMAL HIGH (ref 100–199)
HDL: 37 mg/dL — ABNORMAL LOW (ref >39)
LDL Chol Calc (NIH): 152 mg/dL — ABNORMAL HIGH (ref 0–99)
Triglycerides: 87 mg/dL (ref 0–149)
VLDL Cholesterol Cal: 16 mg/dL (ref 5–40)

## 2023-07-28 ENCOUNTER — Ambulatory Visit: Payer: Commercial Managed Care - PPO | Admitting: Nurse Practitioner

## 2023-09-15 ENCOUNTER — Ambulatory Visit: Payer: Commercial Managed Care - PPO | Admitting: Nurse Practitioner

## 2024-01-18 ENCOUNTER — Other Ambulatory Visit: Payer: Self-pay

## 2024-01-18 ENCOUNTER — Ambulatory Visit: Payer: Commercial Managed Care - PPO | Admitting: Nurse Practitioner

## 2024-01-18 ENCOUNTER — Encounter: Payer: Self-pay | Admitting: Nurse Practitioner

## 2024-01-18 DIAGNOSIS — E876 Hypokalemia: Secondary | ICD-10-CM | POA: Diagnosis not present

## 2024-01-18 DIAGNOSIS — F320 Major depressive disorder, single episode, mild: Secondary | ICD-10-CM

## 2024-01-18 DIAGNOSIS — F419 Anxiety disorder, unspecified: Secondary | ICD-10-CM | POA: Diagnosis not present

## 2024-01-18 DIAGNOSIS — E0590 Thyrotoxicosis, unspecified without thyrotoxic crisis or storm: Secondary | ICD-10-CM

## 2024-01-18 MED ORDER — POTASSIUM CHLORIDE CRYS ER 20 MEQ PO TBCR: 20.0000 meq | EXTENDED_RELEASE_TABLET | Freq: Every day | ORAL | 1 refills | Status: DC

## 2024-01-18 MED ORDER — ALPRAZOLAM 0.5 MG PO TABS: 0.5000 mg | ORAL_TABLET | Freq: Two times a day (BID) | ORAL | 5 refills | Status: DC | PRN

## 2024-01-18 MED ORDER — METHIMAZOLE 5 MG PO TABS: ORAL_TABLET | ORAL | 1 refills | Status: DC

## 2024-01-18 MED ORDER — SERTRALINE HCL 50 MG PO TABS: 50.0000 mg | ORAL_TABLET | Freq: Every day | ORAL | 1 refills | Status: DC

## 2024-01-18 NOTE — Progress Notes (Signed)
 Subjective:    Patient ID: Christian Fowler, male    DOB: [birth year], 38 y.o.   MRN: [id number]   Chief Complaint: Medical Management of Chronic Issues    HPI:  Christian Fowler is a 38 y.o. who identifies as a male who was assigned male at birth.   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 Hyperthyroidism No issue that aware of. Denies palpitations. Is on tapazole . Lab Results  Component Value Date   TSH 1.950 07/20/2023     2. Hypokalemia No c/o muscle cramps Lab Results  Component Value Date   K 4.2 07/20/2023     3. Asymptomatic varicose veins of both lower extremities Does not bother him  4. Anxiety disorder, unspecified type Is on xanax  bid prn    01/18/2024    9:57 AM 07/20/2023   11:11 AM 06/18/2023    2:26 PM 01/23/2023    9:31 AM  GAD 7 : Generalized Anxiety Score  Nervous, Anxious, on Edge 2 1 2 3   Control/stop worrying 2 2 2 3   Worry too much - different things 3 1 2 3   Trouble relaxing 3 3 3 2   Restless 2 2 2 1   Easily annoyed or irritable 2 2 3 1   Afraid - awful might happen 2 1 2 2   Total GAD 7 Score 16 12 16 15   Anxiety Difficulty Somewhat difficult Somewhat difficult Somewhat difficult Somewhat difficult       5. BMI 33.0-33.9,adult Weight is up 7lbs  Wt Readings from Last 3 Encounters:  01/18/24 248 lb (112.5 kg)  07/20/23 241 lb (109.3 kg)  06/18/23 244 lb 3.2 oz (110.8 kg)   BMI Readings from Last 3 Encounters:  01/18/24 35.58 kg/m  07/20/23 34.58 kg/m  06/18/23 35.04 kg/m       New complaints: - wants to discuss depression. Has been feeling down lately. Doesn't feel like doing anything. Is not currently on any antidepressant.    01/18/2024    9:56 AM 07/20/2023   11:10 AM 06/18/2023    2:25 PM  Depression screen PHQ 2/9  Decreased Interest 2 1 1   Down, Depressed, Hopeless 2 2 1   PHQ - 2 Score 4 3 2   Altered sleeping  2 1 3   Tired, decreased energy 3 1 3   Change in appetite 2 2 3   Feeling bad or failure about yourself  0 0 1  Trouble concentrating 1 0 1  Moving slowly or fidgety/restless 1 0 3  Suicidal thoughts 0 0 0  PHQ-9 Score 13 7 16   Difficult doing work/chores Somewhat difficult Somewhat difficult Somewhat difficult     Allergies  Allergen Reactions   Celexa  [Citalopram ]     Suicidal thoughts    Outpatient Encounter Medications as of 01/18/2024  Medication Sig   ALPRAZolam  (XANAX ) 0.5 MG tablet Take 1 tablet (0.5 mg total) by mouth 2 (two) times daily as needed. for anxiety   azithromycin  (ZITHROMAX  Z-PAK) 250 MG tablet As directed   Ginkgo Biloba 40 MG TABS Take by mouth.   methimazole  (TAPAZOLE ) 5 MG tablet TAKE ONE TABLET (5 MG) BY MOUTH EVERY OTHER DAY ALTERNATING  WITH  2  TABLETS  EVERY  OTHER  DAY   Multiple Vitamin (MULTIVITAMIN) tablet Take 1 tablet by mouth daily.   Omega-3 Fatty Acids (FISH OIL PO) Take by mouth.   potassium chloride  SA (KLOR-CON  M20) 20  MEQ tablet Take 1 tablet (20 mEq total) by mouth daily.   Psyllium (METAMUCIL PO) Take by mouth.   No facility-administered encounter medications on file as of 01/18/2024.    History reviewed. No pertinent surgical history.  History reviewed. No pertinent family history.    Controlled substance contract: n/a     Review of Systems  Constitutional:  Negative for diaphoresis.  Eyes:  Negative for pain.  Respiratory:  Negative for shortness of breath.   Cardiovascular:  Negative for chest pain, palpitations and leg swelling.  Gastrointestinal:  Negative for abdominal pain.  Endocrine: Negative for polydipsia.  Skin:  Negative for rash.  Neurological:  Negative for dizziness, weakness and headaches.  Hematological:  Does not bruise/bleed easily.  All other systems reviewed and are negative.      Objective:   Physical Exam Constitutional:      Appearance: He is well-developed.  HENT:     Head: Normocephalic.      Right Ear: External ear normal.     Left Ear: External ear normal.     Nose:     Right Sinus: Maxillary sinus tenderness present.     Left Sinus: Maxillary sinus tenderness present.  Eyes:     Pupils: Pupils are equal, round, and reactive to light.  Neck:     Thyroid : No thyromegaly.     Vascular: No JVD.  Cardiovascular:     Rate and Rhythm: Normal rate and regular rhythm.     Heart sounds: Normal heart sounds. No murmur heard.    No friction rub. No gallop.  Pulmonary:     Effort: Pulmonary effort is normal. No respiratory distress.     Breath sounds: Normal breath sounds. No wheezing or rales.  Chest:     Chest wall: No tenderness.  Abdominal:     General: Bowel sounds are normal.     Palpations: Abdomen is soft. There is no mass.     Tenderness: There is no abdominal tenderness.  Genitourinary:    Penis: Normal.      Prostate: Normal.  Musculoskeletal:        General: Normal range of motion.     Cervical back: Normal range of motion and neck supple.     Right lower leg: Edema (1+) present.     Left lower leg: Edema (1+) present.  Lymphadenopathy:     Cervical: No cervical adenopathy.  Skin:    General: Skin is warm and dry.  Neurological:     Mental Status: He is alert and oriented to person, place, and time.     Cranial Nerves: No cranial nerve deficit.  Psychiatric:        Behavior: Behavior normal.        Thought Content: Thought content normal.        Judgment: Judgment normal.     BP 115/73   Pulse 75   Temp 98.4 F (36.9 C) (Temporal)   Ht 5\' 10"  (1.778 m)   Wt 248 lb (112.5 kg)   SpO2 95%   BMI 35.58 kg/m        Assessment & Plan:   Christian Fowler comes in today with chief complaint of Medical Management of Chronic Issues   Diagnosis and orders addressed:  1. Hyperthyroidism Labs pending - methimazole  (TAPAZOLE ) 5 MG tablet; TAKE ONE TABLET (5 MG) BY MOUTH EVERY OTHER DAY ALTERNATING  WITH  2  TABLETS  EVERY  OTHER  DAY  Dispense: 135  tablet; Refill: 1 -  Thyroid  Panel With TSH  2. Hypokalemia Lab pending - potassium chloride  SA (KLOR-CON  M20) 20 MEQ tablet; Take 1 tablet (20 mEq total) by mouth daily.  Dispense: 90 tablet; Refill: 1 - CBC with Differential/Platelet - CMP14+EGFR - Lipid panel  3. Asymptomatic varicose veins of both lower extremities Continue to wear compression socks  4. Anxiety disorder, unspecified type Stress management  5. Depression Start on zoloft  50mg  daily Stress management   6. BMI 33.0-33.9,adult Discussed diet and exercise for person with BMI >25 Will recheck weight in 3-6 months  Labs pending Health Maintenance reviewed Diet and exercise encouraged  Follow up plan: 6 months   Mary-Margaret Gaylyn Keas, FNP

## 2024-01-18 NOTE — Patient Instructions (Signed)

## 2024-01-19 ENCOUNTER — Ambulatory Visit: Payer: Self-pay | Admitting: Nurse Practitioner

## 2024-01-19 LAB — LIPID PANEL
Chol/HDL Ratio: 5 ratio (ref 0.0–5.0)
Cholesterol, Total: 196 mg/dL (ref 100–199)
HDL: 39 mg/dL — ABNORMAL LOW (ref >39)
LDL Chol Calc (NIH): 141 mg/dL — ABNORMAL HIGH (ref 0–99)
Triglycerides: 87 mg/dL (ref 0–149)
VLDL Cholesterol Cal: 16 mg/dL (ref 5–40)

## 2024-01-19 LAB — CBC WITH DIFFERENTIAL/PLATELET
Basophils Absolute: 0.1 10*3/uL (ref 0.0–0.2)
Basos: 1 %
EOS (ABSOLUTE): 0.3 10*3/uL (ref 0.0–0.4)
Eos: 4 %
Hematocrit: 50 % (ref 37.5–51.0)
Hemoglobin: 16.4 g/dL (ref 13.0–17.7)
Immature Grans (Abs): 0 10*3/uL (ref 0.0–0.1)
Immature Granulocytes: 0 %
Lymphocytes Absolute: 2.7 10*3/uL (ref 0.7–3.1)
Lymphs: 36 %
MCH: 28.3 pg (ref 26.6–33.0)
MCHC: 32.8 g/dL (ref 31.5–35.7)
MCV: 86 fL (ref 79–97)
Monocytes Absolute: 0.7 10*3/uL (ref 0.1–0.9)
Monocytes: 10 %
Neutrophils Absolute: 3.8 10*3/uL (ref 1.4–7.0)
Neutrophils: 49 %
Platelets: 303 10*3/uL (ref 150–450)
RBC: 5.8 x10E6/uL (ref 4.14–5.80)
RDW: 13.6 % (ref 11.6–15.4)
WBC: 7.6 10*3/uL (ref 3.4–10.8)

## 2024-01-19 LAB — CMP14+EGFR
ALT: 38 IU/L (ref 0–44)
AST: 26 IU/L (ref 0–40)
Albumin: 4.6 g/dL (ref 4.1–5.1)
Alkaline Phosphatase: 66 IU/L (ref 44–121)
BUN/Creatinine Ratio: 9 (ref 9–20)
BUN: 8 mg/dL (ref 6–20)
Bilirubin Total: 0.4 mg/dL (ref 0.0–1.2)
CO2: 21 mmol/L (ref 20–29)
Calcium: 9.1 mg/dL (ref 8.7–10.2)
Chloride: 103 mmol/L (ref 96–106)
Creatinine, Ser: 0.89 mg/dL (ref 0.76–1.27)
Globulin, Total: 2.5 g/dL (ref 1.5–4.5)
Glucose: 87 mg/dL (ref 70–99)
Potassium: 4 mmol/L (ref 3.5–5.2)
Sodium: 141 mmol/L (ref 134–144)
Total Protein: 7.1 g/dL (ref 6.0–8.5)
eGFR: 115 mL/min/{1.73_m2} (ref >59)

## 2024-01-20 LAB — TOXASSURE SELECT 13 (MW), URINE

## 2024-04-19 ENCOUNTER — Ambulatory Visit: Payer: Self-pay

## 2024-04-19 NOTE — Telephone Encounter (Signed)
 Patient has an appt scheduled for 9-4 to discuss then

## 2024-04-19 NOTE — Telephone Encounter (Signed)
 FYI Only or Action Required?: Action required by provider: Patient requesting new medication, requesting something he can also take Ibuprofen with.  Patient was last seen in primary care on 01/18/2024 by Gladis Mustard, FNP.  Called Nurse Triage reporting Medication Reaction.  Symptoms began At start of medication, approx 6 weeks ago.  Interventions attempted: OTC medications: Motrin.  Symptoms are: unchanged.  Triage Disposition: Call PCP When Office is Open  Patient/caregiver understands and will follow disposition?: Yes             Copied from CRM #[id number]. Topic: Clinical - Red Word Triage >> Apr 19, 2024  8:34 AM Cherylann RAMAN wrote: Red Word that prompted transfer to Nurse Triage: Patient called in with complaints of dizziness, headaches, and nausea. Patient states that he experienced these symptoms after taking sertraline  (ZOLOFT ) 50 MG tablet. Patient states that he has tried taking half a pill but nothing worked and symptoms remained. He also tried to take ibuprofen with the medication for the headaches but when someone speaks to him; he could hear them but could not comprehend what they were saying. Patient was not warned that he could not take ibuprofen when taking the medication. Patient is requesting to come off the medication as he does not like feeling this way. He also states that provider stated it takes some time at least 6 weeks for the medication to get in system, however, it has been a little over 6 weeks and symptoms remain. Reason for Disposition  [1] Caller has NON-URGENT medicine question about med that PCP prescribed AND [2] triager unable to answer question  Answer Assessment - Initial Assessment Questions 1. NAME of MEDICINE: What medicine(s) are you calling about?     Zoloft  50 mg 2. QUESTION: What is your question? (e.g., double dose of medicine, side effect)     Would like to switch medications and wean off medication 3. PRESCRIBER: Who  prescribed the medicine? Reason: if prescribed by specialist, call should be referred to that group.     PCP 4. SYMPTOMS: Do you have any symptoms? If Yes, ask: What symptoms are you having?  How bad are the symptoms (e.g., mild, moderate, severe)     HA, dizziness, nausea  Protocols used: Medication Question Call-A-AH

## 2024-04-19 NOTE — Telephone Encounter (Signed)
 Will have to make appt to discuss

## 2024-04-21 ENCOUNTER — Ambulatory Visit: Admitting: Nurse Practitioner

## 2024-04-21 ENCOUNTER — Encounter: Payer: Self-pay | Admitting: Nurse Practitioner

## 2024-04-21 VITALS — BP 125/79 | HR 111 | Temp 98.1°F | Ht 70.0 in | Wt 252.0 lb

## 2024-04-21 DIAGNOSIS — F320 Major depressive disorder, single episode, mild: Secondary | ICD-10-CM | POA: Diagnosis not present

## 2024-04-21 MED ORDER — VILAZODONE HCL 10 MG PO TABS: 10.0000 mg | ORAL_TABLET | Freq: Every day | ORAL | 1 refills | Status: DC

## 2024-04-21 NOTE — Progress Notes (Signed)
 Subjective:    Patient ID: Gladstone Gunner, male    DOB: 05-Jul-1986, 38 y.o.   MRN: [id number]   Chief Complaint: discuss meds  HPI  Patient was seen on 01/18/24. He was having depression and anxiety at that time . Was started on zoloft  50mg  daily. He says he gets headache every time he takes it. Started breaking in half and that helped a little. Still thinks he needs somethin.    04/21/2024   12:13 PM 01/18/2024    9:57 AM 07/20/2023   11:11 AM 06/18/2023    2:26 PM  GAD 7 : Generalized Anxiety Score  Nervous, Anxious, on Edge 3 2 1 2   Control/stop worrying 2 2 2 2   Worry too much - different things 3 3 1 2   Trouble relaxing 3 3 3 3   Restless 1 2 2 2   Easily annoyed or irritable 2 2 2 3   Afraid - awful might happen 2 2 1 2   Total GAD 7 Score 16 16 12 16   Anxiety Difficulty Somewhat difficult Somewhat difficult Somewhat difficult Somewhat difficult       04/21/2024   12:13 PM 01/18/2024    9:56 AM 07/20/2023   11:10 AM  Depression screen PHQ 2/9  Decreased Interest 1 2 1   Down, Depressed, Hopeless 1 2 2   PHQ - 2 Score 2 4 3   Altered sleeping 2 2 1   Tired, decreased energy 3 3 1   Change in appetite 2 2 2   Feeling bad or failure about yourself  1 0 0  Trouble concentrating 1 1 0  Moving slowly or fidgety/restless 1 1 0  Suicidal thoughts 0 0 0  PHQ-9 Score 12 13 7   Difficult doing work/chores Somewhat difficult Somewhat difficult Somewhat difficult    Patient Active Problem List   Diagnosis Date Noted   Morbid obesity (HCC) 01/18/2024   Asymptomatic varicose veins of both lower extremities 06/01/2018   Hyperthyroidism 12/01/2017   Eczema 12/01/2017   Hypokalemia 12/01/2017   Anxiety disorder 10/28/2017       Review of Systems  Constitutional:  Negative for diaphoresis.  Eyes:  Negative for pain.  Respiratory:  Negative for shortness of breath.   Cardiovascular:  Negative for chest pain, palpitations and leg swelling.  Gastrointestinal:  Negative for abdominal pain.   Endocrine: Negative for polydipsia.  Skin:  Negative for rash.  Neurological:  Negative for dizziness, weakness and headaches.  Hematological:  Does not bruise/bleed easily.  All other systems reviewed and are negative.      Objective:   Physical Exam Constitutional:      Appearance: Normal appearance. He is obese.  Cardiovascular:     Rate and Rhythm: Normal rate and regular rhythm.     Heart sounds: Normal heart sounds.  Pulmonary:     Breath sounds: Normal breath sounds.  Skin:    General: Skin is warm.  Neurological:     General: No focal deficit present.     Mental Status: He is alert and oriented to person, place, and time.  Psychiatric:        Mood and Affect: Mood normal.        Behavior: Behavior normal.    BP 125/79   Pulse (!) 111   Temp 98.1 F (36.7 C) (Temporal)   Ht 5' 10 (1.778 m)   Wt 252 lb (114.3 kg)   BMI 36.16 kg/m         Assessment & Plan:   Gladstone Gunner in today  with chief complaint of No chief complaint on file.   1. Depression, major, single episode, mild (HCC) (Primary) Stopzoloft Start viibryd  Stress management  - Vilazodone  HCl (VIIBRYD ) 10 MG TABS; Take 1 tablet (10 mg total) by mouth daily.  Dispense: 30 tablet; Refill: 1    The above assessment and management plan was discussed with the patient. The patient verbalized understanding of and has agreed to the management plan. Patient is aware to call the clinic if symptoms persist or worsen. Patient is aware when to return to the clinic for a follow-up visit. Patient educated on when it is appropriate to go to the emergency department.   Mary-Margaret Gladis, FNP

## 2024-04-21 NOTE — Patient Instructions (Signed)
 Vilazodone Tablets What is this medication? VILAZODONE (vil [place] oh done) treats depression. It increases the amount of serotonin in the brain, a substance that helps regulate mood. This medicine may be used for other purposes; ask your health care provider or pharmacist if you have questions. COMMON BRAND NAME(S): VIIBRYD What should I tell my care team before I take this medication? They need to know if you have any of these conditions: Bipolar disorder or a family history of bipolar disorder Glaucoma Liver disease Low levels of sodium in the blood Receiving electroconvulsive therapy (ECT) Seizures Suicidal thoughts, plans, or attempt by you or a family member An unusual or allergic reaction to vilazodone, other medications, foods, dyes, or preservatives Pregnant or trying to get pregnant Breastfeeding How should I use this medication? Take this medication by mouth with water. Take it as directed on the prescription label at the same time every day. Take it with food. Keep taking it unless your care team tells you to stop. A special MedGuide will be given to you by the pharmacist with each prescription and refill. Be sure to read this information carefully each time. Talk to your care team about the use of this medication in children. Special care may be needed. Overdosage: If you think you have taken too much of this medicine contact a poison control center or emergency room at once. NOTE: This medicine is only for you. Do not share this medicine with others. What if I miss a dose? If you miss a dose, take it as soon as you can. If it is almost time for your next dose, take only that dose. Do not take double or extra doses. What may interact with this medication? Do not take this medication with any of the following: Linezolid MAOIs, such as Marplan, Nardil, and Parnate Methylene blue (injected into a vein) This medication may also interact with the following: Aspirin and aspirin-like  medications Certain medications for depression, anxiety, or other mental health conditions Certain medications for migraines, such as sumatriptan Certain medications for seizures Lithium Medications that treat or prevent blood clots, such as warfarin NSAIDs, medications for pain and inflammation, such as ibuprofen or naproxen Opioids St. John's wort Stimulant medications for ADHD, weight loss, or staying awake Tryptophan This list may not describe all possible interactions. Give your health care provider a list of all the medicines, herbs, non-prescription drugs, or dietary supplements you use. Also tell them if you smoke, drink alcohol, or use illegal drugs. Some items may interact with your medicine. What should I watch for while using this medication? Visit your care team for regular checks on your progress. It may be some time before you see the benefit from this medication. This medication may cause thoughts of suicide or depression. This includes sudden changes in mood, behaviors, or thoughts. These changes can happen at any time but are more common in the beginning of treatment or after a change in dose. Call your care team right away if you experience these thoughts or worsening depression. This medication may affect your coordination, reaction time, or judgment. Do not drive or operate machinery until you know how this medication affects you. Sit up or stand slowly to reduce the risk of dizzy or fainting spells. Drinking alcohol with this medication can increase the risk of these side effects. Your mouth may get dry. Chewing sugarless gum or sucking hard candy and drinking plenty of water may help. Contact your care team if the problem does not go  away or is severe. What side effects may I notice from receiving this medication? Side effects that you should report to your care team as soon as possible: Allergic reactions--skin rash, itching, hives, swelling of the face, lips, tongue, or  throat Bleeding--bloody or black, tar-like stools, vomiting blood or brown material that looks like coffee grounds, red or dark brown urine, small red or purple spots on skin, unusual bruising or bleeding Irritability, confusion, fast or irregular heartbeat, muscle stiffness, twitching muscles, sweating, high fever, seizure, chills, vomiting, diarrhea, which may be signs of serotonin syndrome Low sodium level--muscle weakness, fatigue, dizziness, headache, confusion Seizures Sudden eye pain or change in vision such as blurry vision, seeing halos around lights, vision loss Thoughts of suicide or self-harm, worsening mood, feelings of depression Side effects that usually do not require medical attention (report to your care team if they continue or are bothersome): Change in sex drive or performance Diarrhea Dry mouth Headache Nausea Trouble sleeping Vomiting This list may not describe all possible side effects. Call your doctor for medical advice about side effects. You may report side effects to FDA at 1-800-FDA-1088. Where should I keep my medication? Keep out of the reach of children. Store at room temperature between 15 and 30 degrees C (59 to 86 degrees F). Throw away any unused medication after the expiration date. NOTE: This sheet is a summary. It may not cover all possible information. If you have questions about this medicine, talk to your doctor, pharmacist, or health care provider.  2024 Elsevier/Gold Standard (2022-12-24 00:00:00)

## 2024-06-02 ENCOUNTER — Telehealth: Payer: Self-pay

## 2024-06-02 NOTE — Telephone Encounter (Signed)
 Copied from CRM #[id number]. Topic: Clinical - Medical Advice >> Jun 02, 2024  9:27 AM Diannia H wrote: Reason for CRM: Patient states that the medicine Vilazodone  HCl (VIIBRYD ) 10 MG TABS keeps him so dizzy and he is wanting to wen him self off of it. He has been taking this medicine for 6 weeks and its been this way since day one. Its to the point where when he takes it he has to pull over from driving because he's so dizzy. He has to regroup, close his eyes and has even had to call in because he feels so bad. Patients callback number is [phone].

## 2024-06-02 NOTE — Telephone Encounter (Signed)
 Wean off viibryd - needs to have genesight test done

## 2024-06-03 NOTE — Telephone Encounter (Signed)
 Patient notified and verbalized understanding. Is in the process of weaning off the Viibryd . He is due to come back for regular follow up in November and would like to do a Genesight test and also STD testing.

## 2024-06-15 ENCOUNTER — Telehealth: Payer: Self-pay

## 2024-06-15 NOTE — Telephone Encounter (Unsigned)
 Copied from CRM [id number]. Topic: Clinical - Lab/Test Results >> Jun 15, 2024  9:51 AM Tonda B wrote: Reason for CRM: patient has questions about getting tested for hashimoto diseases he is needing a code for the testing please call pt back  [phone] (M

## 2024-06-16 NOTE — Telephone Encounter (Signed)
 He will need to be seen  to have labs ordered

## 2024-06-16 NOTE — Telephone Encounter (Signed)
 Pt made aware.He will wait until the appt in Nov to discuss.  Pt is also in contact with endocrinology. Aware of thyroid  enlargement but does not need to be removed.

## 2024-07-11 ENCOUNTER — Ambulatory Visit: Payer: Self-pay | Admitting: Nurse Practitioner

## 2024-07-11 VITALS — BP 112/73 | HR 82 | Temp 97.1°F | Ht 70.0 in | Wt 248.0 lb

## 2024-07-11 DIAGNOSIS — E876 Hypokalemia: Secondary | ICD-10-CM | POA: Diagnosis not present

## 2024-07-11 DIAGNOSIS — E0590 Thyrotoxicosis, unspecified without thyrotoxic crisis or storm: Secondary | ICD-10-CM | POA: Diagnosis not present

## 2024-07-11 DIAGNOSIS — F320 Major depressive disorder, single episode, mild: Secondary | ICD-10-CM | POA: Diagnosis not present

## 2024-07-11 DIAGNOSIS — Z113 Encounter for screening for infections with a predominantly sexual mode of transmission: Secondary | ICD-10-CM

## 2024-07-11 DIAGNOSIS — F419 Anxiety disorder, unspecified: Secondary | ICD-10-CM | POA: Diagnosis not present

## 2024-07-11 LAB — LIPID PANEL

## 2024-07-11 MED ORDER — POTASSIUM CHLORIDE CRYS ER 20 MEQ PO TBCR: 20.0000 meq | EXTENDED_RELEASE_TABLET | Freq: Every day | ORAL | 1 refills | Status: AC

## 2024-07-11 MED ORDER — METHIMAZOLE 5 MG PO TABS: ORAL_TABLET | ORAL | 1 refills | Status: AC

## 2024-07-11 MED ORDER — ALPRAZOLAM 0.5 MG PO TABS
0.5000 mg | ORAL_TABLET | Freq: Two times a day (BID) | ORAL | 5 refills | Status: AC | PRN
Start: 2024-07-11 — End: ?

## 2024-07-11 NOTE — Progress Notes (Signed)
 Subjective:    Patient ID: Christian Fowler, male    DOB: 07-06-1986, 38 y.o.   MRN: [id number]   Chief Complaint: medical management of chronic issues     HPI:  Christian Fowler is a 38 y.o. who identifies as a male who was assigned male at birth.   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 Hyperthyroidism No issue that aware of. Denies palpitations. Is on tapazole . Lab Results  Component Value Date   TSH 1.950 07/20/2023     2. Hypokalemia No c/o muscle cramps Lab Results  Component Value Date   K 4.0 01/18/2024     3. Asymptomatic varicose veins of both lower extremities Does not bother him  4. Anxiety disorder, unspecified type Is on xanax  bid prn    07/11/2024   11:25 AM 04/21/2024   12:13 PM 01/18/2024    9:57 AM 07/20/2023   11:11 AM  GAD 7 : Generalized Anxiety Score  Nervous, Anxious, on Edge 3 3 2 1   Control/stop worrying 2 2 2 2   Worry too much - different things 2 3 3 1   Trouble relaxing 3 3 3 3   Restless 3 1 2 2   Easily annoyed or irritable 1 2 2 2   Afraid - awful might happen 1 2 2 1   Total GAD 7 Score 15 16 16 12   Anxiety Difficulty Somewhat difficult Somewhat difficult Somewhat difficult Somewhat difficult     5. depression We recently tried him on viibryd  and he says that it did not help. Would like to do gene sight testing.    07/11/2024   11:24 AM 04/21/2024   12:13 PM 01/18/2024    9:56 AM  Depression screen PHQ 2/9  Decreased Interest 1 1 2   Down, Depressed, Hopeless 1 1 2   PHQ - 2 Score 2 2 4   Altered sleeping 2 2 2   Tired, decreased energy 2 3 3   Change in appetite 3 2 2   Feeling bad or failure about yourself  1 1 0  Trouble concentrating 1 1 1   Moving slowly or fidgety/restless 0 1 1  Suicidal thoughts 0 0 0  PHQ-9 Score 11 12  13    Difficult doing work/chores Somewhat difficult Somewhat difficult Somewhat difficult     Data  saved with a previous flowsheet row definition     BMI 33.0-33.9,adult Weight is up 4lbs Wt Readings from Last 3 Encounters:  04/21/24 252 lb (114.3 kg)  01/18/24 248 lb (112.5 kg)  07/20/23 241 lb (109.3 kg)   BP Readings from Last 3 Encounters:  04/21/24 125/79  01/18/24 115/73  07/20/23 117/72      New complaints: None  today  Allergies  Allergen Reactions   Celexa  [Citalopram ]     Suicidal thoughts    Outpatient Encounter Medications as of 07/11/2024  Medication Sig   ALPRAZolam  (XANAX ) 0.5 MG tablet Take 1 tablet (0.5 mg total) by mouth 2 (two) times daily as needed. for anxiety   Ginkgo Biloba 40 MG TABS Take by mouth.   methimazole  (TAPAZOLE ) 5 MG tablet TAKE ONE TABLET (5 MG) BY MOUTH EVERY OTHER DAY ALTERNATING  WITH  2  TABLETS  EVERY  OTHER  DAY   Multiple Vitamin (MULTIVITAMIN) tablet Take 1 tablet by mouth daily.   Omega-3 Fatty Acids (FISH OIL PO) Take by mouth.   potassium chloride  SA (KLOR-CON  M20) 20 MEQ tablet  Take 1 tablet (20 mEq total) by mouth daily.   Psyllium (METAMUCIL PO) Take by mouth.   sertraline  (ZOLOFT ) 50 MG tablet Take 1 tablet (50 mg total) by mouth daily.   Vilazodone  HCl (VIIBRYD ) 10 MG TABS Take 1 tablet (10 mg total) by mouth daily.   No facility-administered encounter medications on file as of 07/11/2024.    No past surgical history on file.  No family history on file.    Controlled substance contract: n/a     Review of Systems  Constitutional:  Negative for diaphoresis.  Eyes:  Negative for pain.  Respiratory:  Negative for shortness of breath.   Cardiovascular:  Negative for chest pain, palpitations and leg swelling.  Gastrointestinal:  Negative for abdominal pain.  Endocrine: Negative for polydipsia.  Skin:  Negative for rash.  Neurological:  Negative for dizziness, weakness and headaches.  Hematological:  Does not bruise/bleed easily.  All other systems reviewed and are negative.      Objective:   Physical  Exam Constitutional:      Appearance: He is well-developed.  HENT:     Head: Normocephalic.     Right Ear: External ear normal.     Left Ear: External ear normal.     Nose:     Right Sinus: Maxillary sinus tenderness present.     Left Sinus: Maxillary sinus tenderness present.  Eyes:     Pupils: Pupils are equal, round, and reactive to light.  Neck:     Thyroid : No thyromegaly.     Vascular: No JVD.  Cardiovascular:     Rate and Rhythm: Normal rate and regular rhythm.     Heart sounds: Normal heart sounds. No murmur heard.    No friction rub. No gallop.  Pulmonary:     Effort: Pulmonary effort is normal. No respiratory distress.     Breath sounds: Normal breath sounds. No wheezing or rales.  Chest:     Chest wall: No tenderness.  Abdominal:     General: Bowel sounds are normal.     Palpations: Abdomen is soft. There is no mass.     Tenderness: There is no abdominal tenderness.  Genitourinary:    Penis: Normal.      Prostate: Normal.  Musculoskeletal:        General: Normal range of motion.     Cervical back: Normal range of motion and neck supple.     Right lower leg: Edema (1+) present.     Left lower leg: Edema (1+) present.  Lymphadenopathy:     Cervical: No cervical adenopathy.  Skin:    General: Skin is warm and dry.  Neurological:     Mental Status: He is alert and oriented to person, place, and time.     Cranial Nerves: No cranial nerve deficit.  Psychiatric:        Behavior: Behavior normal.        Thought Content: Thought content normal.        Judgment: Judgment normal.     BP 112/73   Pulse 82   Temp (!) 97.1 F (36.2 C) (Temporal)   Ht 5' 10 (1.778 m)   Wt 248 lb (112.5 kg)   SpO2 97%   BMI 35.58 kg/m         Assessment & Plan:   Christian Fowler comes in today with chief complaint of medical management of chronic issues    Diagnosis and orders addressed:  1. Hyperthyroidism Labs pending - methimazole  (TAPAZOLE ) 5  MG tablet; TAKE ONE  TABLET (5 MG) BY MOUTH EVERY OTHER DAY ALTERNATING  WITH  2  TABLETS  EVERY  OTHER  DAY  Dispense: 135 tablet; Refill: 1 - Thyroid  Panel With TSH  2. Hypokalemia Lab pending - potassium chloride  SA (KLOR-CON  M20) 20 MEQ tablet; Take 1 tablet (20 mEq total) by mouth daily.  Dispense: 90 tablet; Refill: 1 - CBC with Differential/Platelet - CMP14+EGFR - Lipid panel  3. Asymptomatic varicose veins of both lower extremities Continue to wear compression socks  4. Anxiety disorder, unspecified type Stress management  5. Depression Gene sight testing done  6. BMI 33.0-33.9,adult Discussed diet and exercise for person with BMI >25 Will recheck weight in 3-6 months   Labs pending Health Maintenance reviewed Diet and exercise encouraged  Follow up plan: 6 months   Mary-Margaret Gladis, FNP

## 2024-07-11 NOTE — Patient Instructions (Signed)
 Major Depressive Disorder, Adult Major depressive disorder (MDD) is a mental health condition. People with this disorder feel very sad, hopeless, and lose interest in things. Symptoms last most of the day, almost every day, for 2 weeks. MDD can affect: Relationships. Work and school. Things you usually like to do. What are the causes? The cause of MDD is not known. What increases the risk? Having family members with depression. Being male. Family problems. Alcohol or drug misuse. A lot of stress in your life, such as from: Living without basic needs such as food and housing. Being treated poorly because of race, sex, or religion (discrimination). Things that caused you pain as a child, especially if you lost a parent or were abused. Health and mental problems that you have had for a long time. What are the signs or symptoms? The main symptoms of this condition are: Being sad all the time. Being grouchy (irritable) all the time. Not enjoying the things you usually like. Sleeping too much or too little. Eating too much or too little. Feeling tired. Other symptoms include: Gaining or losing weight, without knowing why. Being restless and weak. Feeling hopeless, worthless, or guilty. Trouble thinking or making decisions. Thoughts of hurting yourself or others, or thoughts of ending your life. Spending a lot of time alone. Being unable to do daily tasks. If you have very bad MDD, you may: Believe things that are not true. Hear, see, taste, or feel things that are not there. Have mild depression that lasts for at least 2 years. Feel very sad and hopeless. Have trouble speaking or moving. Feel very sad during some seasons. How is this treated? Talk therapy. This teaches you about thoughts, feelings, and actions and how to change them. This can also help you to talk with others. This can be done with members of your family. Medicines. Lifestyle changes. You may need to: Limit  alcohol use. Stop using drugs, if you use them. Exercise. Get plenty of sleep. Eat healthy. Spend more time outdoors. Brain stimulation. This may be done when symptoms are very bad or have not gotten better. Follow these instructions at home: Alcohol use Do not drink alcohol if: Your health care provider tells you not to drink. You are pregnant, may be pregnant, or are planning to become pregnant. If you drink alcohol: Limit how much you use to: 0-1 drink a day for women. 0-2 drinks a day for men. Know how much alcohol is in your drink. In the [place] one drink equals one 12 oz bottle of beer (355 mL), one 5 oz glass of wine (148 mL), or one 1 oz glass of hard liquor (44 mL). Activity Exercise as told by your doctor. Spend time outdoors. Make time to do the things you enjoy. Find ways to deal with stress. Try to: Meditate. Do deep breathing. Spend time in nature. Keep a journal. Return to your normal activities when your doctor says that it is safe. General instructions  Take over-the-counter and prescription medicines only as told by your doctor. Talk to your doctor about: Alcohol use. It can affect medicines. Any drug use. Eat healthy foods. Get a lot of sleep. Think about joining a support group. Ask your doctor about that. Keep all follow-up visits. Your doctor will need to check on your mood, behavior, and medicines, and change your treatment as needed. Where to find more information: [employer] on Mental Illness: [employer]ami[employer] of Mental Health: BloggerCourse.com American Psychiatric Association: psychiatry.org Contact a doctor  if: You feel worse. You get new symptoms. Get help right away if: You hurt yourself on purpose (self-harm). You have thoughts about hurting yourself or others. You see, hear, taste, smell, or feel things that are not there. Get help right away if you feel like you may hurt yourself or others, or have thoughts about taking  your own life. Go to your nearest emergency room or: Call 911. Call the National Suicide Prevention Lifeline at (513) 004-4926 or 988. This is open 24 hours a day. Text the Crisis Text Line at (314) 815-6587. This information is not intended to replace advice given to you by your health care provider. Make sure you discuss any questions you have with your health care provider. Document Revised: 12/10/2021 Document Reviewed: 12/10/2021 Elsevier Patient Education  2024 [employer].

## 2024-07-11 NOTE — Addendum Note (Signed)
 Addended by: GLADIS MUSTARD on: 07/11/2024 12:01 PM   Modules accepted: Orders

## 2024-07-11 NOTE — Addendum Note (Signed)
 Addended by: VIKTORIA ALAN MATSU on: 07/11/2024 12:08 PM   Modules accepted: Orders

## 2024-07-12 ENCOUNTER — Ambulatory Visit: Payer: Self-pay | Admitting: Nurse Practitioner

## 2024-07-12 LAB — CBC WITH DIFFERENTIAL/PLATELET
Basophils Absolute: 0 x10E3/uL (ref 0.0–0.2)
Basos: 1 %
EOS (ABSOLUTE): 0.2 x10E3/uL (ref 0.0–0.4)
Eos: 3 %
Hematocrit: 51.7 % — ABNORMAL HIGH (ref 37.5–51.0)
Hemoglobin: 17 g/dL (ref 13.0–17.7)
Immature Grans (Abs): 0 x10E3/uL (ref 0.0–0.1)
Immature Granulocytes: 0 %
Lymphocytes Absolute: 1.7 x10E3/uL (ref 0.7–3.1)
Lymphs: 32 %
MCH: 28.3 pg (ref 26.6–33.0)
MCHC: 32.9 g/dL (ref 31.5–35.7)
MCV: 86 fL (ref 79–97)
Monocytes Absolute: 0.5 x10E3/uL (ref 0.1–0.9)
Monocytes: 9 %
Neutrophils Absolute: 2.9 x10E3/uL (ref 1.4–7.0)
Neutrophils: 55 %
Platelets: 306 x10E3/uL (ref 150–450)
RBC: 6 x10E6/uL — ABNORMAL HIGH (ref 4.14–5.80)
RDW: 13.5 % (ref 11.6–15.4)
WBC: 5.3 x10E3/uL (ref 3.4–10.8)

## 2024-07-12 LAB — LIPID PANEL
Cholesterol, Total: 181 mg/dL (ref 100–199)
HDL: 36 mg/dL — AB (ref >39)
LDL CALC COMMENT:: 5 ratio (ref 0.0–5.0)
LDL Chol Calc (NIH): 128 mg/dL — AB (ref 0–99)
Triglycerides: 90 mg/dL (ref 0–149)
VLDL Cholesterol Cal: 17 mg/dL (ref 5–40)

## 2024-07-12 LAB — CMP14+EGFR
ALT: 42 IU/L (ref 0–44)
AST: 29 IU/L (ref 0–40)
Albumin: 4.6 g/dL (ref 4.1–5.1)
Alkaline Phosphatase: 69 IU/L (ref 47–123)
BUN/Creatinine Ratio: 9 (ref 9–20)
BUN: 8 mg/dL (ref 6–20)
Bilirubin Total: 0.6 mg/dL (ref 0.0–1.2)
CO2: 24 mmol/L (ref 20–29)
Calcium: 9.3 mg/dL (ref 8.7–10.2)
Chloride: 103 mmol/L (ref 96–106)
Creatinine, Ser: 0.86 mg/dL (ref 0.76–1.27)
Globulin, Total: 2.3 g/dL (ref 1.5–4.5)
Glucose: 96 mg/dL (ref 70–99)
Potassium: 4.1 mmol/L (ref 3.5–5.2)
Sodium: 141 mmol/L (ref 134–144)
Total Protein: 6.9 g/dL (ref 6.0–8.5)
eGFR: 115 mL/min/1.73 (ref >59)

## 2024-07-12 LAB — GC/CHLAMYDIA PROBE AMP
Chlamydia trachomatis, NAA: NEGATIVE
Neisseria Gonorrhoeae by PCR: NEGATIVE

## 2024-07-12 LAB — THYROID PANEL WITH TSH
Free Thyroxine Index: 1.8 (ref 1.2–4.9)
T3 Uptake Ratio: 24 % (ref 24–39)
T4, Total: 7.3 ug/dL (ref 4.5–12.0)
TSH: 2.7 u[IU]/mL (ref 0.450–4.500)

## 2024-07-12 LAB — HEPB+HEPC+HIV PANEL
HIV Screen 4th Generation wRfx: NONREACTIVE
Hep B E Ab: NONREACTIVE
Hep B Surface Ab, Qual: REACTIVE
Hep C Virus Ab: NONREACTIVE

## 2024-08-24 ENCOUNTER — Encounter: Payer: Self-pay | Admitting: Nurse Practitioner

## 2025-01-10 ENCOUNTER — Ambulatory Visit: Admitting: Nurse Practitioner
